# Patient Record
Sex: Female | Born: 1965 | Race: Black or African American | Hispanic: No | Smoking: Never smoker
Health system: Southern US, Community
[De-identification: ages and names within clinical notes are randomized; demographics above are authoritative.]

## PROBLEM LIST (undated history)

## (undated) DIAGNOSIS — R928 Other abnormal and inconclusive findings on diagnostic imaging of breast: Secondary | ICD-10-CM

## (undated) DIAGNOSIS — R87619 Unspecified abnormal cytological findings in specimens from cervix uteri: Secondary | ICD-10-CM

## (undated) HISTORY — DX: Unspecified abnormal cytological findings in specimens from cervix uteri: R87.619

---

## 2001-10-28 DIAGNOSIS — R87619 Unspecified abnormal cytological findings in specimens from cervix uteri: Secondary | ICD-10-CM

## 2001-10-28 HISTORY — DX: Unspecified abnormal cytological findings in specimens from cervix uteri: R87.619

## 2009-09-26 ENCOUNTER — Ambulatory Visit (HOSPITAL_COMMUNITY): Admission: RE | Admit: 2009-09-26 | Discharge: 2009-09-26 | Payer: Self-pay | Admitting: Obstetrics & Gynecology

## 2010-09-21 ENCOUNTER — Ambulatory Visit (HOSPITAL_COMMUNITY)
Admission: RE | Admit: 2010-09-21 | Discharge: 2010-09-21 | Payer: Self-pay | Source: Home / Self Care | Admitting: Obstetrics and Gynecology

## 2011-08-20 ENCOUNTER — Other Ambulatory Visit (HOSPITAL_COMMUNITY): Payer: Self-pay | Admitting: Physician Assistant

## 2011-09-13 ENCOUNTER — Other Ambulatory Visit (HOSPITAL_COMMUNITY): Payer: Self-pay | Admitting: Physician Assistant

## 2011-09-13 DIAGNOSIS — Z1231 Encounter for screening mammogram for malignant neoplasm of breast: Secondary | ICD-10-CM

## 2011-09-24 ENCOUNTER — Ambulatory Visit (HOSPITAL_COMMUNITY)
Admission: RE | Admit: 2011-09-24 | Discharge: 2011-09-24 | Disposition: A | Discharge status: Home / Self Care | Payer: Self-pay | Source: Ambulatory Visit | Attending: Physician Assistant | Admitting: Physician Assistant

## 2011-09-24 DIAGNOSIS — Z1231 Encounter for screening mammogram for malignant neoplasm of breast: Secondary | ICD-10-CM

## 2012-09-15 ENCOUNTER — Other Ambulatory Visit (HOSPITAL_COMMUNITY): Payer: Self-pay | Admitting: Physician Assistant

## 2012-09-15 DIAGNOSIS — Z1231 Encounter for screening mammogram for malignant neoplasm of breast: Secondary | ICD-10-CM

## 2012-10-02 ENCOUNTER — Ambulatory Visit (HOSPITAL_COMMUNITY)
Admission: RE | Admit: 2012-10-02 | Discharge: 2012-10-02 | Disposition: A | Discharge status: Home / Self Care | Payer: Self-pay | Source: Ambulatory Visit | Attending: Physician Assistant | Admitting: Physician Assistant

## 2012-10-02 DIAGNOSIS — Z1231 Encounter for screening mammogram for malignant neoplasm of breast: Secondary | ICD-10-CM

## 2013-11-16 ENCOUNTER — Other Ambulatory Visit (HOSPITAL_COMMUNITY): Payer: Self-pay | Admitting: Physician Assistant

## 2013-11-16 DIAGNOSIS — Z1231 Encounter for screening mammogram for malignant neoplasm of breast: Secondary | ICD-10-CM

## 2013-11-18 ENCOUNTER — Ambulatory Visit (HOSPITAL_COMMUNITY)
Admission: RE | Admit: 2013-11-18 | Discharge: 2013-11-18 | Disposition: A | Discharge status: Home / Self Care | Payer: Self-pay | Source: Ambulatory Visit | Attending: Physician Assistant | Admitting: Physician Assistant

## 2013-11-18 DIAGNOSIS — Z1231 Encounter for screening mammogram for malignant neoplasm of breast: Secondary | ICD-10-CM

## 2015-02-24 ENCOUNTER — Other Ambulatory Visit (HOSPITAL_COMMUNITY): Payer: Self-pay | Admitting: Physician Assistant

## 2015-02-24 DIAGNOSIS — Z1231 Encounter for screening mammogram for malignant neoplasm of breast: Secondary | ICD-10-CM

## 2015-03-06 ENCOUNTER — Ambulatory Visit (HOSPITAL_COMMUNITY): Payer: Self-pay

## 2016-11-21 LAB — POCT WET PREP WITH KOH
CLUE CELLS WET PREP PER HPF POC: NEGATIVE
KOH Prep POC: NEGATIVE
RBC WET PREP PER HPF POC: NEGATIVE
Trichomonas, UA: NEGATIVE
Yeast Wet Prep HPF POC: NEGATIVE

## 2017-07-02 ENCOUNTER — Encounter: Payer: Self-pay | Admitting: Internal Medicine

## 2017-07-02 ENCOUNTER — Ambulatory Visit (INDEPENDENT_AMBULATORY_CARE_PROVIDER_SITE_OTHER): Payer: Self-pay | Admitting: Internal Medicine

## 2017-07-02 VITALS — BP 120/80 | HR 66 | Resp 12 | Ht 63.0 in | Wt 133.0 lb

## 2017-07-02 DIAGNOSIS — Z594 Lack of adequate food and safe drinking water: Secondary | ICD-10-CM

## 2017-07-02 DIAGNOSIS — Z9189 Other specified personal risk factors, not elsewhere classified: Secondary | ICD-10-CM

## 2017-07-02 DIAGNOSIS — Z0120 Encounter for dental examination and cleaning without abnormal findings: Secondary | ICD-10-CM

## 2017-07-02 DIAGNOSIS — Z1239 Encounter for other screening for malignant neoplasm of breast: Secondary | ICD-10-CM

## 2017-07-02 DIAGNOSIS — Z5941 Food insecurity: Secondary | ICD-10-CM

## 2017-07-02 DIAGNOSIS — G4700 Insomnia, unspecified: Secondary | ICD-10-CM

## 2017-07-02 DIAGNOSIS — Z1231 Encounter for screening mammogram for malignant neoplasm of breast: Secondary | ICD-10-CM

## 2017-07-02 NOTE — Progress Notes (Signed)
   Subjective:    Patient ID: Madison Adkins, female    DOB: 07-14-1966, 51 y.o.   MRN: [id number]  HPI   Here to establish  Interested in getting set up for physical.    Would like to get up with mammogram.  Would like to get in with the dentist.  No problems of which she is aware.   Would like to have eyes examined.  Does not have the money, yet however.  Intermittently has difficulty getting to sleep  Very down she is unable to further her education.   Current Meds  Medication Sig  . VITAMIN D, ERGOCALCIFEROL, PO Take by mouth. occasionally    No Known Allergies   History reviewed. No pertinent past medical history.   History reviewed. No pertinent surgical history.   Family History  Problem Relation Age of Onset  . Cancer Sister        Breast  . Allergies Daughter     Social History   Social History  . Marital status: Divorced    Spouse name: N/A  . Number of children: 3  . Years of education: 12+   Occupational History  . House cleaning    Social History Main Topics  . Smoking status: Never Smoker  . Smokeless tobacco: Never Used  . Alcohol use No  . Drug use: No  . Sexual activity: Not on file   Other Topics Concern  . Not on file   Social History Narrative   Originally from [place]   Came to [place] [employer] In 2001   Lives with her 2 daughter.   Father not involved.   Goes to [employer] [place]   Would like to go to college            Review of Systems     Objective:   Physical Exam  NAD HEENT:  PERRL, EOMI, TMs pearly gray, throat without injection.    Neck:  Supple, No adenopathy, no thyromegaly Chest:  CTA Breasts:  No focal mass, skin dimpling, nipple discharge or axillary adenopathy. CV:  RRR with normal S1 and S2, No S3, S4 or murmur.  Radial and DP pulses normal and equal       Assessment & Plan:  1.  Health maintenance:  Schedule mammogram.  Scholarship as well.  2. Need for dental care:  Dental referral.  3. Insomnia:   Discussed good sleep hygiene, regular physical activity outside.  Read if unable to fall asleep among other things.  4.  Food insecurity:  SW intern working with patient.  Also referral to [facility] to see about where she can obtain food regularly.

## 2017-07-02 NOTE — Progress Notes (Signed)
Social Work Theatre manager completed new patient screening on Social Determinants of Health. Pt shared that she has worries about accessing food and having enough. Pt stated that she was not interested in help with resources due to a lack of transportation. SWI gave contact info.

## 2017-07-02 NOTE — Patient Instructions (Signed)
Look into [employer]

## 2017-07-15 ENCOUNTER — Other Ambulatory Visit: Payer: Self-pay | Admitting: Internal Medicine

## 2017-07-15 DIAGNOSIS — Z1231 Encounter for screening mammogram for malignant neoplasm of breast: Secondary | ICD-10-CM

## 2017-08-24 DIAGNOSIS — Z594 Lack of adequate food and safe drinking water: Secondary | ICD-10-CM | POA: Insufficient documentation

## 2017-08-24 DIAGNOSIS — G4700 Insomnia, unspecified: Secondary | ICD-10-CM | POA: Insufficient documentation

## 2017-08-24 DIAGNOSIS — Z5941 Food insecurity: Secondary | ICD-10-CM | POA: Insufficient documentation

## 2017-10-07 ENCOUNTER — Ambulatory Visit: Payer: Self-pay | Admitting: Internal Medicine

## 2017-10-28 DIAGNOSIS — E785 Hyperlipidemia, unspecified: Secondary | ICD-10-CM

## 2017-10-28 HISTORY — DX: Hyperlipidemia, unspecified: E78.5

## 2017-11-21 ENCOUNTER — Encounter: Payer: Self-pay | Admitting: Internal Medicine

## 2017-11-21 ENCOUNTER — Ambulatory Visit: Payer: Self-pay | Admitting: Internal Medicine

## 2017-11-21 VITALS — BP 110/78 | HR 62 | Resp 12 | Ht 63.0 in | Wt 135.5 lb

## 2017-11-21 DIAGNOSIS — Z13220 Encounter for screening for lipoid disorders: Secondary | ICD-10-CM

## 2017-11-21 DIAGNOSIS — Z124 Encounter for screening for malignant neoplasm of cervix: Secondary | ICD-10-CM

## 2017-11-21 DIAGNOSIS — Z0000 Encounter for general adult medical examination without abnormal findings: Secondary | ICD-10-CM

## 2017-11-21 NOTE — Patient Instructions (Addendum)
Can google "advance directives, [place]"  And bring up form from Secretary of [place]. Print and fill out Or can go to "5 wishes"  Which is also in Spanish and fill out--this costs $5--perhaps easier to use. Designate a Forensic scientist to speak for you if you are unable to speak for yourself when ill or injured  Call for eye appointment when you are ready

## 2017-11-21 NOTE — Progress Notes (Signed)
Subjective:    Patient ID: Madison Adkins, female    DOB: [birth year], 52 y.o.   MRN: [id number]  HPI   CPE with pap  1.  Pap:  Last pap 4-5 years ago at [facility].  History of abnormal pap many years ago.  She is not sure if she just had coloposcopy and biopsy or if she actually had some sort of treatment.  Not aware of any family history.   2.  Mammogram: Was referred for mammogram.  She states she was called for an appt.  She has tried to call back, but has not been successful in setting this up.  Sister died of breast cancer before age 20 yo.  3.  Osteoprevention: Limited dairy intake.  Previously was taking Calcium and Vitamin D tabs, but stopped .  Has never had bone density done.  She has not had a period since 52 years of age.  4.  Guaiac Cards:  Never.  Not aware of any family history of colon cancer  5.  Colonoscopy:  Never.  As above  6.  Immunizations:  She believes she had immunizations when came to [place] 2001 at [facility] in [place] and [place].  7.  Glucose/Cholesterol:  Never told her glucose or cholesterol is high.  No outpatient medications have been marked as taking for the 11/21/17 encounter (Office Visit) with Mack Hook, MD.    No Known Allergies   Past Medical History:  Diagnosis Date  . Abnormal Pap smear of cervix 2003   [facility]   History reviewed. No pertinent surgical history.   Social History   Socioeconomic History  . Marital status: Divorced    Spouse name: Not on file  . Number of children: 3  . Years of education: 12+  . Highest education level: Not on file  Social Needs  . Financial resource strain: Hard  . Food insecurity - worry: Often true  . Food insecurity - inability: Often true  . Transportation needs - medical: Yes  . Transportation needs - non-medical: Yes  Occupational History  . Occupation: Development worker, community  Tobacco Use  . Smoking status: Never Smoker  . Smokeless tobacco: Never Used  Substance and Sexual Activity  .  Alcohol use: No  . Drug use: No  . Sexual activity: No    Birth control/protection: Post-menopausal  Other Topics Concern  . Not on file  Social History Narrative   Originally from [place]   Came to [employer]. In [place]   Lives with her 2 daughters.   Father not involved.   Goes to [employer] [place]   Would like to go to college    Family History  Problem Relation Age of Onset  . Cancer Sister        Breast  . Allergies Daughter       Review of Systems  Constitutional: Negative for activity change, appetite change, fatigue and fever.  HENT: Positive for dental problem (needs to pick at teeth with toothpick). Negative for hearing loss and sore throat.   Eyes: Positive for visual disturbance (using reading glasses.  Unable to afford eye doctor currently.).  Respiratory: Negative for cough and shortness of breath.   Cardiovascular: Negative for chest pain, palpitations and leg swelling.  Gastrointestinal: Negative for abdominal pain, blood in stool (no melena), constipation and diarrhea.  Genitourinary: Negative for dysuria, frequency and vaginal discharge.  Musculoskeletal:       Left ring finger trigger finger.  Generally only first  thing in morning and only intermittent.  Has been a problem for 1 year.  Skin: Negative for rash.  Neurological: Negative for seizures, weakness and numbness.  Hematological: Negative for adenopathy.  Psychiatric/Behavioral: Negative for dysphoric mood. The patient is not nervous/anxious.        Objective:   Physical Exam  Constitutional: She is oriented to person, place, and time. She appears well-developed and well-nourished.  HENT:  Head: Normocephalic and atraumatic.  Right Ear: Hearing, tympanic membrane and external ear normal.  Left Ear: Hearing, tympanic membrane, external ear and ear canal normal.  Nose: Nose normal.  Mouth/Throat: Uvula is midline and oropharynx is clear and moist.  Eyes: Conjunctivae and EOM are normal. Pupils are  equal, round, and reactive to light.  Discs sharp bilaterally  Neck: Normal range of motion and full passive range of motion without pain. Neck supple. No thyromegaly present.  Cardiovascular: Normal rate, regular rhythm, S1 normal and S2 normal. Exam reveals no S3, no S4 and no friction rub.  No murmur heard. No carotid bruits.  Carotid, radial, femoral, DP and PT pulses normal and equal.   Pulmonary/Chest: Effort normal and breath sounds normal. Right breast exhibits no inverted nipple, no mass, no nipple discharge, no skin change and no tenderness. Left breast exhibits no inverted nipple, no mass, no nipple discharge, no skin change and no tenderness.  Abdominal: Soft. Bowel sounds are normal. She exhibits no mass. There is no hepatosplenomegaly. There is no tenderness. No hernia.  Genitourinary:  Genitourinary Comments: Normal external genitilia.  No cervical lesion. No uterine or adnexal mass or tenderness.  Musculoskeletal: Normal range of motion.  Lymphadenopathy:       Head (right side): No submental and no submandibular adenopathy present.       Head (left side): No submental and no submandibular adenopathy present.    She has no cervical adenopathy.    She has no axillary adenopathy.       Right: No inguinal and no supraclavicular adenopathy present.       Left: No inguinal and no supraclavicular adenopathy present.  Neurological: She is alert and oriented to person, place, and time. She has normal strength and normal reflexes. No cranial nerve deficit or sensory deficit. Coordination and gait normal.  Skin: Skin is warm. No rash noted.  Psychiatric: She has a normal mood and affect. Her speech is normal and behavior is normal. Thought content normal.          Assessment & Plan:  1.  CPE with pap. Checking into getting her scheduled for mammogram--apparent difficulty in communication from previous order. FLP, CBC, CMP  2.  Release for [facility] and [facility]  medical records  3.  Dental referral:  Looking into why she has not been contacted for dental referral from September.  4. Vision:  She will save for optometry appt through [facility] and notify when she wants to proceed.

## 2017-11-22 LAB — COMPREHENSIVE METABOLIC PANEL
A/G RATIO: 1.4 (ref 1.2–2.2)
ALBUMIN: 4.3 g/dL (ref 3.5–5.5)
ALT: 14 IU/L (ref 0–32)
AST: 17 IU/L (ref 0–40)
Alkaline Phosphatase: 47 IU/L (ref 39–117)
BILIRUBIN TOTAL: 0.2 mg/dL (ref 0.0–1.2)
BUN / CREAT RATIO: 18 (ref 9–23)
BUN: 11 mg/dL (ref 6–24)
CHLORIDE: 100 mmol/L (ref 96–106)
CO2: 28 mmol/L (ref 20–29)
Calcium: 9.6 mg/dL (ref 8.7–10.2)
Creatinine, Ser: 0.61 mg/dL (ref 0.57–1.00)
GFR, EST AFRICAN AMERICAN: 122 mL/min/{1.73_m2} (ref >59)
GFR, EST NON AFRICAN AMERICAN: 106 mL/min/{1.73_m2} (ref >59)
Globulin, Total: 3 g/dL (ref 1.5–4.5)
Glucose: 93 mg/dL (ref 65–99)
POTASSIUM: 4 mmol/L (ref 3.5–5.2)
Sodium: 140 mmol/L (ref 134–144)
TOTAL PROTEIN: 7.3 g/dL (ref 6.0–8.5)

## 2017-11-22 LAB — CBC WITH DIFFERENTIAL/PLATELET
Basophils Absolute: 0 10*3/uL (ref 0.0–0.2)
Basos: 0 %
EOS (ABSOLUTE): 0.1 10*3/uL (ref 0.0–0.4)
EOS: 1 %
HEMATOCRIT: 38.7 % (ref 34.0–46.6)
HEMOGLOBIN: 12.3 g/dL (ref 11.1–15.9)
IMMATURE GRANS (ABS): 0 10*3/uL (ref 0.0–0.1)
Immature Granulocytes: 0 %
LYMPHS: 55 %
Lymphocytes Absolute: 3.1 10*3/uL (ref 0.7–3.1)
MCH: 27.2 pg (ref 26.6–33.0)
MCHC: 31.8 g/dL (ref 31.5–35.7)
MCV: 85 fL (ref 79–97)
MONOCYTES: 7 %
Monocytes Absolute: 0.4 10*3/uL (ref 0.1–0.9)
NEUTROS ABS: 2.1 10*3/uL (ref 1.4–7.0)
Neutrophils: 37 %
Platelets: 223 10*3/uL (ref 150–379)
RBC: 4.53 x10E6/uL (ref 3.77–5.28)
RDW: 13.4 % (ref 12.3–15.4)
WBC: 5.7 10*3/uL (ref 3.4–10.8)

## 2017-11-22 LAB — LIPID PANEL W/O CHOL/HDL RATIO
Cholesterol, Total: 251 mg/dL — ABNORMAL HIGH (ref 100–199)
HDL: 84 mg/dL (ref >39)
LDL Calculated: 158 mg/dL — ABNORMAL HIGH (ref 0–99)
Triglycerides: 47 mg/dL (ref 0–149)
VLDL CHOLESTEROL CAL: 9 mg/dL (ref 5–40)

## 2017-11-24 LAB — CYTOLOGY - PAP

## 2017-12-04 ENCOUNTER — Other Ambulatory Visit: Payer: Self-pay

## 2017-12-05 NOTE — Progress Notes (Signed)
Patient scheduled for 01/15/18

## 2017-12-24 ENCOUNTER — Other Ambulatory Visit: Payer: Self-pay | Admitting: Obstetrics and Gynecology

## 2017-12-24 DIAGNOSIS — Z1231 Encounter for screening mammogram for malignant neoplasm of breast: Secondary | ICD-10-CM

## 2018-01-15 ENCOUNTER — Encounter (HOSPITAL_COMMUNITY): Payer: Self-pay

## 2018-01-15 ENCOUNTER — Ambulatory Visit (HOSPITAL_COMMUNITY)
Admission: RE | Admit: 2018-01-15 | Discharge: 2018-01-15 | Disposition: A | Discharge status: Home / Self Care | Payer: Self-pay | Source: Ambulatory Visit | Attending: Obstetrics and Gynecology | Admitting: Obstetrics and Gynecology

## 2018-01-15 ENCOUNTER — Ambulatory Visit
Admission: RE | Admit: 2018-01-15 | Discharge: 2018-01-15 | Disposition: A | Discharge status: Home / Self Care | Payer: No Typology Code available for payment source | Source: Ambulatory Visit | Attending: Obstetrics and Gynecology | Admitting: Obstetrics and Gynecology

## 2018-01-15 VITALS — BP 110/76 | Ht 63.0 in | Wt 134.2 lb

## 2018-01-15 DIAGNOSIS — Z1231 Encounter for screening mammogram for malignant neoplasm of breast: Secondary | ICD-10-CM

## 2018-01-15 DIAGNOSIS — Z1239 Encounter for other screening for malignant neoplasm of breast: Secondary | ICD-10-CM

## 2018-01-15 NOTE — Patient Instructions (Signed)
Explained breast self awareness with Shannara Laurie. Patient did not need a Pap smear today due to last Pap smear was 11/21/2017. Let her know [facility] will cover Pap smears every 3 years unless has a history of abnormal Pap smears. Referred patient to the [facility] for a screening mammogram. Appointment scheduled for Thursday, January 15, 2018 at 1310. Let patient know the [facility] Center will follow up with her within the next couple weeks with results of mammogram by letter or phone. Madison Adkins verbalized understanding.  Dahlia Nifong, Arvil Chaco, RN 12:49 PM

## 2018-01-15 NOTE — Progress Notes (Signed)
No complaints today.   Pap Smear: Pap smear not completed today. Last Pap smear was 11/21/2017 at [facility] and normal. Per patient has a history of an abnormal Pap smear in 2001 and unsure what follow-up if any was completed. Last Pap smear result is in Epic.  Physical exam: Breasts Breasts symmetrical. No skin abnormalities bilateral breasts. No nipple retraction bilateral breasts. No nipple discharge bilateral breasts. No lymphadenopathy. No lumps palpated bilateral breasts. No complaints of pain or tenderness on exam. Referred patient to the [facility] for a screening mammogram. Appointment scheduled for Thursday, January 15, 2018 at 1310.        Pelvic/Bimanual No Pap smear completed today since last Pap smear was 1/25/20019. Pap smear not indicated per BCCCP guidelines.   Smoking History: Patient has never smoked.  Patient Navigation: Patient education provided. Access to services provided for patient through [place] program.    Colorectal Cancer Screening: Per patient has never had a colonoscopy completed. No complaints today. FIT Test given to patient to complete and return to [facility].  Breast and Cervical Cancer Risk Assessment: Patient has a family history of her sister having breast cancer. Patient has no known genetic mutations or history of radiation treatment to the chest before age 64. Patient has no history of cervical dysplasia, immunocompromised, or DES exposure in-utero.

## 2018-01-20 ENCOUNTER — Encounter (HOSPITAL_COMMUNITY): Payer: Self-pay | Admitting: *Deleted

## 2018-02-02 ENCOUNTER — Other Ambulatory Visit: Payer: Self-pay

## 2018-02-08 LAB — FECAL OCCULT BLOOD, IMMUNOCHEMICAL: FECAL OCCULT BLD: NEGATIVE

## 2018-02-09 ENCOUNTER — Encounter (HOSPITAL_COMMUNITY): Payer: Self-pay

## 2018-03-31 ENCOUNTER — Other Ambulatory Visit: Payer: Self-pay

## 2018-03-31 DIAGNOSIS — Z13220 Encounter for screening for lipoid disorders: Secondary | ICD-10-CM

## 2018-04-01 LAB — LIPID PANEL W/O CHOL/HDL RATIO
Cholesterol, Total: 258 mg/dL — ABNORMAL HIGH (ref 100–199)
HDL: 83 mg/dL (ref >39)
LDL CALC: 164 mg/dL — AB (ref 0–99)
Triglycerides: 56 mg/dL (ref 0–149)
VLDL CHOLESTEROL CAL: 11 mg/dL (ref 5–40)

## 2018-04-21 ENCOUNTER — Encounter: Payer: Self-pay | Admitting: Internal Medicine

## 2018-08-31 ENCOUNTER — Other Ambulatory Visit: Payer: Self-pay

## 2018-08-31 DIAGNOSIS — E7800 Pure hypercholesterolemia, unspecified: Secondary | ICD-10-CM

## 2018-09-01 LAB — LIPID PANEL W/O CHOL/HDL RATIO
Cholesterol, Total: 245 mg/dL — ABNORMAL HIGH (ref 100–199)
HDL: 74 mg/dL (ref >39)
LDL CALC: 157 mg/dL — AB (ref 0–99)
Triglycerides: 72 mg/dL (ref 0–149)
VLDL Cholesterol Cal: 14 mg/dL (ref 5–40)

## 2018-09-02 ENCOUNTER — Encounter: Payer: Self-pay | Admitting: Internal Medicine

## 2018-09-02 ENCOUNTER — Ambulatory Visit: Payer: Self-pay | Admitting: Internal Medicine

## 2018-09-02 VITALS — BP 122/82 | HR 72 | Resp 12 | Ht 63.0 in | Wt 128.0 lb

## 2018-09-02 DIAGNOSIS — E7800 Pure hypercholesterolemia, unspecified: Secondary | ICD-10-CM

## 2018-09-02 NOTE — Progress Notes (Signed)
   Subjective:    Patient ID: Eleonore Shippee, female    DOB: 01/29/1966, 52 y.o.   MRN: [id number]  HPI  Hypercholesterolemia:  Admits that last month she was visiting her daughter and ws not eating as healthily as she could be doing.  Was using a lot of palm and coconut oil, which she has stopped and moved to olive oil  Also has returned to oatmeal recently daily.   2.  HM:  Did get flu vaccine.  States did get mammogram in March of 2019 with [facility]--she does have a card stating she is due, but states she did have it done as well.  Current Meds  Medication Sig  . VITAMIN D, ERGOCALCIFEROL, PO Take by mouth. occasionally    No Known Allergies  Review of Systems     Objective:   Physical Exam NAD Lungs:  CTA CV:  RRR without murmur or rub.  Radial and DP pulses normal and equal       Assessment & Plan:  Hypercholesterolemia:  Goal to get LDL below 130.  HDL is excellent.

## 2018-12-07 ENCOUNTER — Telehealth: Payer: Self-pay | Admitting: *Deleted

## 2018-12-07 NOTE — Telephone Encounter (Signed)
Telephoned patient left message to return a call to [facility] regarding a mammo appointment.

## 2018-12-08 ENCOUNTER — Other Ambulatory Visit (HOSPITAL_COMMUNITY): Payer: Self-pay | Admitting: *Deleted

## 2018-12-08 DIAGNOSIS — Z1231 Encounter for screening mammogram for malignant neoplasm of breast: Secondary | ICD-10-CM

## 2018-12-09 ENCOUNTER — Other Ambulatory Visit (HOSPITAL_COMMUNITY): Payer: Self-pay | Admitting: *Deleted

## 2018-12-09 DIAGNOSIS — Z1231 Encounter for screening mammogram for malignant neoplasm of breast: Secondary | ICD-10-CM

## 2018-12-14 ENCOUNTER — Other Ambulatory Visit (INDEPENDENT_AMBULATORY_CARE_PROVIDER_SITE_OTHER): Payer: Self-pay

## 2018-12-14 DIAGNOSIS — E7800 Pure hypercholesterolemia, unspecified: Secondary | ICD-10-CM

## 2018-12-15 LAB — LIPID PANEL W/O CHOL/HDL RATIO
Cholesterol, Total: 250 mg/dL — ABNORMAL HIGH (ref 100–199)
HDL: 75 mg/dL (ref >39)
LDL CALC: 162 mg/dL — AB (ref 0–99)
Triglycerides: 64 mg/dL (ref 0–149)
VLDL CHOLESTEROL CAL: 13 mg/dL (ref 5–40)

## 2018-12-16 ENCOUNTER — Encounter: Payer: Self-pay | Admitting: Internal Medicine

## 2018-12-16 ENCOUNTER — Ambulatory Visit: Payer: Self-pay | Admitting: Internal Medicine

## 2018-12-16 VITALS — BP 120/80 | HR 60 | Ht 63.0 in | Wt 125.0 lb

## 2018-12-16 DIAGNOSIS — Z9189 Other specified personal risk factors, not elsewhere classified: Secondary | ICD-10-CM

## 2018-12-16 DIAGNOSIS — Z23 Encounter for immunization: Secondary | ICD-10-CM

## 2018-12-16 DIAGNOSIS — L989 Disorder of the skin and subcutaneous tissue, unspecified: Secondary | ICD-10-CM

## 2018-12-16 DIAGNOSIS — Z0000 Encounter for general adult medical examination without abnormal findings: Secondary | ICD-10-CM

## 2018-12-16 NOTE — Progress Notes (Signed)
Subjective:    Patient ID: Madison Adkins, female   DOB: [birth year], 53 y.o.   MRN: [id number]   HPI   CPE without pap  1.  Pap:  Last pap 10/2017 and normal. Distant history of abnormal..  No family history of cervical cancer.  2.  Mammogram:  Last 01/15/18 and normal.  Scheduled for June of 2020 next.  Always normal.  Sister died of breast cancer at age 39 yo. [place].  3.  Osteoprevention:  Drinks almond milk and cheese sometimes.  She is willing to drink 3 cups of almond milk daily.  Walks all the time and loves to hike.    4.  Guaiac Cards:  Last year negative in April.  5.  Colonoscopy:  Never.  No family history of colon cancer.  6.  Immunizations:  Due for Tdap--has never had this form.  Immunization History  Administered Date(s) Administered  . Influenza-Unspecified 08/25/2018  . Td 08/28/2009    7.  Glucose/Cholesterol:  History of elevated total and LDL cholesterol, but HDL also quite high.  Father died age 41 and mother died at 43.  No autopsy done.  Father was ill leading up to death, but patient away in boarding school at the time.   Mother died after complained of throat vs neck pain and was sent home from the hospital.   Current Meds  Medication Sig  . VITAMIN D, ERGOCALCIFEROL, PO Take by mouth. occasionally   No Known Allergies   Past Medical History:  Diagnosis Date  . Abnormal Pap smear of cervix 2003   [facility]    History reviewed. No pertinent surgical history.  Family History  Problem Relation Age of Onset  . Cancer Sister        Breast  . Allergies Daughter     Social History   Socioeconomic History  . Marital status: Divorced    Spouse name: Not on file  . Number of children: 3  . Years of education: 12+  . Highest education level: Not on file  Occupational History  . Occupation: Development worker, community  Social Needs  . Financial resource strain: Hard  . Food insecurity:    Worry: Often true    Inability: Often true  .  Transportation needs:    Medical: Yes    Non-medical: Yes  Tobacco Use  . Smoking status: Never Smoker  . Smokeless tobacco: Never Used  Substance and Sexual Activity  . Alcohol use: Yes    Comment: rarely  . Drug use: No  . Sexual activity: Never    Birth control/protection: Post-menopausal  Lifestyle  . Physical activity:    Days per week: 7 days    Minutes per session: 20 min  . Stress: To some extent  Relationships  . Social connections:    Talks on phone: Once a week    Gets together: More than three times a week    Attends religious service: More than 4 times per year    Active member of club or organization: Yes    Attends meetings of clubs or organizations: More than 4 times per year    Relationship status: Divorced  . Intimate partner violence:    Fear of current or ex partner: No    Emotionally abused: No    Physically abused: No    Forced sexual activity: No  Other Topics Concern  . Not on file  Social History Narrative   Originally from [place]   [place]ame [place]   Lives with her 2 daughters.   Father not involved.   Go[place] to [employer] [place]   Would like to go to college.  She is waiting until her daughters are 52 yo.      Review of Systems  Constitutional: Negative for appetite change, fatigue and fever.  HENT: Negative for dental problem, ear pain, hearing loss, rhinorrhea and sore throat.   Eyes: Negative for visual disturbance.  Respiratory: Negative for shortness of breath.   Cardiovascular: Negative for chest pain, palpitations and leg swelling.  Gastrointestinal: Negative for abdominal pain, blood in stool (No melena), constipation and diarrhea.  Genitourinary: Negative for dysuria, frequency, menstrual problem and vaginal discharge.  Musculoskeletal: Negative for arthralgias.  Neurological: Negative for weakness and numbness.  Psychiatric/Behavioral: Negative for dysphoric mood. The patient is not nervous/anxious.       Objective:     BP 120/80 (BP Location: Left Arm, Patient Position: Sitting, Cuff Size: Normal)   Pulse 60   Ht 5\' 3"  (1.6 m)   Wt 125 lb (56.7 kg)   LMP 09/11/2011   BMI 22.14 kg/m   Physical Exam  Constitutional: She is oriented to person, place, and time. She appears well-developed and well-nourished.  HENT:  Head: Normocephalic and atraumatic.  Right Ear: Hearing, external ear and ear canal normal.  Left Ear: Hearing, external ear and ear canal normal.  Nose: Nose normal.  Mouth/Throat: Uvula is midline, oropharynx is clear and moist and mucous membranes are normal.  TMs a bit dull, but normal in color.  Eyes: Pupils are equal, round, and reactive to light. Conjunctivae and EOM are normal.  Discs sharp bilaterally.  Neck: Normal range of motion and full passive range of motion without pain. Neck supple. Thyromegaly (smooth enlargement.) present.  Cardiovascular: Normal rate, regular rhythm, S1 normal and S2 normal. Exam reveals no S3, no S4 and no friction rub.  No murmur heard. No carotid bruits.  Carotid, radial, femoral, DP and PT pulses normal and equal.   Pulmonary/Chest: Effort normal and breath sounds normal. Right breast exhibits no inverted nipple, no mass, no nipple discharge and no skin change. Left breast exhibits no inverted nipple, no mass, no nipple discharge and no skin change.  Abdominal: Soft. Bowel sounds are normal. She exhibits no mass. There is no hepatosplenomegaly. There is no abdominal tenderness. No hernia.  Genitourinary:    Genitourinary Comments: Normal external female genitalia. No uterine or adnexal mass or tenderness. Rectal:  No mass, heme negative light brown stool.   Musculoskeletal: Normal range of motion.  Lymphadenopathy:       Head (right side): No submental and no submandibular adenopathy present.       Head (left side): No submental and no submandibular adenopathy present.    She has no cervical adenopathy.    She has no axillary adenopathy.        Right: No inguinal and no supraclavicular adenopathy present.       Left: No inguinal and no supraclavicular adenopathy present.  Neurological: She is alert and oriented to person, place, and time. She has normal strength and normal reflexes. No cranial nerve deficit or sensory deficit. Coordination and gait normal.  Skin: Skin is warm. No rash noted.  1 cm hyperpigmented lesion at left frontal hair line. No bleeding or flaking.  Areas of lesion are slightly palpable  Psychiatric: She has a normal mood and affect. Her speech is normal and behavior is normal. Judgment and thought  content normal. Cognition and memory are normal.       Assessment & Plan   1.  Annual Exam Mammogram scheduled for later in year. Guaiac Cards x 3 to return in 2 weeks. Will need TSH as well then.   2.  Elevated cholesterol with very high HDL and somewhat high LDL:  To continue to work on diet if has changes to make for improvement.  3.  Looking to help with education:  Referral to [facility] to see about the possibility of [place].  I have contacted Eligah East.  He will look into [employer] for the possibility of online courses.  I will check with [facility]. Signed consent for [facility] as well.  4.  Dental referral  5.  Skin lesion, left frontal hairline:  Appears to be benign, but will send to Dermatology for opinion.

## 2018-12-17 ENCOUNTER — Encounter: Payer: Self-pay | Admitting: Internal Medicine

## 2018-12-23 ENCOUNTER — Other Ambulatory Visit (INDEPENDENT_AMBULATORY_CARE_PROVIDER_SITE_OTHER): Payer: Self-pay

## 2018-12-23 DIAGNOSIS — Z1211 Encounter for screening for malignant neoplasm of colon: Secondary | ICD-10-CM

## 2018-12-23 LAB — POC HEMOCCULT BLD/STL (HOME/3-CARD/SCREEN)
Card #2 Fecal Occult Blod, POC: NEGATIVE
FECAL OCCULT BLD: NEGATIVE
Fecal Occult Blood, POC: NEGATIVE

## 2019-01-04 ENCOUNTER — Telehealth: Payer: Self-pay | Admitting: Internal Medicine

## 2019-01-04 NOTE — Telephone Encounter (Signed)
Patient called to see if Dr. Amil Amen has checked on college for her.   [phone]

## 2019-01-06 NOTE — Telephone Encounter (Signed)
Please let her know I have emailed someone at [facility] and hope to hear back soon.  In the meantime, have her google the [employer] and see what she thinks--this is what I AM LOOKING INTO FOR HER.  I dont know why my computer would not let me type without caps, but not shouting there.

## 2019-01-21 NOTE — Telephone Encounter (Signed)
Spoke with patient. Information given. Patient verbalized understanding

## 2019-03-16 ENCOUNTER — Ambulatory Visit (HOSPITAL_COMMUNITY): Payer: No Typology Code available for payment source

## 2019-04-06 ENCOUNTER — Encounter (HOSPITAL_COMMUNITY): Payer: Self-pay

## 2019-04-06 ENCOUNTER — Ambulatory Visit
Admission: RE | Admit: 2019-04-06 | Discharge: 2019-04-06 | Disposition: A | Discharge status: Home / Self Care | Payer: Self-pay | Source: Ambulatory Visit | Attending: Obstetrics and Gynecology | Admitting: Obstetrics and Gynecology

## 2019-04-06 ENCOUNTER — Other Ambulatory Visit: Payer: Self-pay

## 2019-04-06 ENCOUNTER — Ambulatory Visit (HOSPITAL_COMMUNITY)
Admission: RE | Admit: 2019-04-06 | Discharge: 2019-04-06 | Disposition: A | Discharge status: Home / Self Care | Payer: No Typology Code available for payment source | Source: Ambulatory Visit | Attending: Obstetrics and Gynecology | Admitting: Obstetrics and Gynecology

## 2019-04-06 VITALS — BP 108/64 | Temp 99.0°F | Wt 128.0 lb

## 2019-04-06 DIAGNOSIS — Z1239 Encounter for other screening for malignant neoplasm of breast: Secondary | ICD-10-CM

## 2019-04-06 DIAGNOSIS — Z1231 Encounter for screening mammogram for malignant neoplasm of breast: Secondary | ICD-10-CM

## 2019-04-06 NOTE — Patient Instructions (Signed)
Explained breast self awareness with Madison Adkins. Patient did not need a Pap smear today due to last Pap smear was 11/21/2017. Let her know [facility] will cover Pap smears every 3 years unless has a history of abnormal Pap smears. Referred patient to the [facility] for a screening mammogram. Appointment scheduled for Tuesday, April 06, 2019 at 1530. Patient aware of appointment and will be there. Let patient know the Breast Center will follow up with her within the next couple weeks with results of mammogram by letter or phone. [name] verbalized understanding.  Sincere Liuzzi, Arvil Chaco, RN 2:25 PM

## 2019-04-06 NOTE — Progress Notes (Signed)
No complaints today.   Pap Smear: Pap smear not completed today. Last Pap smear was 11/21/2017 at [facility] and normal. Per patient has a history of an abnormal Pap smear in 2001 that thinks a colposcopy was completed for follow-up. Per patient has had at least three normal Pap smears since abnormal. Last Pap smear result is in Epic.  Physical exam: Breasts Breasts symmetrical. No skin abnormalities bilateral breasts. No nipple retraction bilateral breasts. No nipple discharge bilateral breasts. No lymphadenopathy. No lumps palpated bilateral breasts. No complaints of pain or tenderness on exam. Referred patient to the [facility] for a screening mammogram. Appointment scheduled for Tuesday, April 06, 2019 at 1530.        Pelvic/Bimanual No Pap smear completed today since last Pap smear was 11/21/2017. Pap smear not indicated per BCCCP guidelines.   Smoking History: Patient has never smoked.  Patient Navigation: Patient education provided. Access to services provided for patient through [place] program.   Colorectal Cancer Screening: Per patient has never had a colonoscopy completed. FIT Test completed 12/23/2018 that was negative. No complaints today.   Breast and Cervical Cancer Risk Assessment: Patient has a family hisory of her sister having breast cancer. Patient has no known genetic mutations or history of radiation treatment to the chest before age 58. Per patient has a history of cervical dysplasia. Patient has no history of being immunocompromised or DES exposure in-utero.  Risk Assessment    Risk Scores      04/06/2019   Last edited by: Armond Hang, LPN   5-year risk: 1.9 %   Lifetime risk: 13.3 %

## 2019-04-07 ENCOUNTER — Encounter (HOSPITAL_COMMUNITY): Payer: Self-pay | Admitting: *Deleted

## 2020-02-03 ENCOUNTER — Ambulatory Visit: Payer: No Typology Code available for payment source | Attending: Internal Medicine

## 2020-02-03 DIAGNOSIS — Z23 Encounter for immunization: Secondary | ICD-10-CM

## 2020-02-03 NOTE — Progress Notes (Signed)
   Covid-19 Vaccination Clinic  Name:  Nyeli Diosdado    MRN: [id number] DOB: 02/10/1966  02/03/2020  Ms. Tio was observed post Covid-19 immunization for 15 minutes without incident. She was provided with Vaccine Information Sheet and instruction to access the V-Safe system.   Ms. Asay was instructed to call 911 with any severe reactions post vaccine: Marland Kitchen Difficulty breathing  . Swelling of face and throat  . A fast heartbeat  . A bad rash all over body  . Dizziness and weakness   Immunizations Administered    Name Date Dose VIS Date Route   Pfizer COVID-19 Vaccine 02/03/2020  4:35 PM 0.3 mL 10/08/2019 Intramuscular   Manufacturer: [place]   Lot: [id number]   [place]: [id number]

## 2020-02-28 ENCOUNTER — Ambulatory Visit: Payer: No Typology Code available for payment source | Attending: Internal Medicine

## 2020-02-28 DIAGNOSIS — Z23 Encounter for immunization: Secondary | ICD-10-CM

## 2020-02-28 NOTE — Progress Notes (Signed)
   Covid-19 Vaccination Clinic  Name:  Madison Adkins    MRN: [id number] DOB: 1966/08/10  02/28/2020  Madison Adkins was observed post Covid-19 immunization for 15 minutes without incident. She was provided with Vaccine Information Sheet and instruction to access the V-Safe system.   Madison Adkins was instructed to call 911 with any severe reactions post vaccine: Marland Kitchen Difficulty breathing  . Swelling of face and throat  . A fast heartbeat  . A bad rash all over body  . Dizziness and weakness   Immunizations Administered    Name Date Dose VIS Date Route   Pfizer COVID-19 Vaccine 02/28/2020  3:38 PM 0.3 mL 12/22/2018 Intramuscular   Manufacturer: Sunflower   Lot: [id number]   [place]: [id number]

## 2020-05-26 ENCOUNTER — Encounter: Payer: Self-pay | Admitting: Internal Medicine

## 2020-07-26 ENCOUNTER — Ambulatory Visit: Payer: Self-pay | Admitting: Internal Medicine

## 2020-07-26 ENCOUNTER — Encounter: Payer: Self-pay | Admitting: Internal Medicine

## 2020-07-26 VITALS — BP 118/70 | HR 70 | Resp 12 | Ht 63.0 in | Wt 116.0 lb

## 2020-07-26 DIAGNOSIS — Z0000 Encounter for general adult medical examination without abnormal findings: Secondary | ICD-10-CM

## 2020-07-26 DIAGNOSIS — E7800 Pure hypercholesterolemia, unspecified: Secondary | ICD-10-CM

## 2020-07-26 DIAGNOSIS — H547 Unspecified visual loss: Secondary | ICD-10-CM

## 2020-07-26 DIAGNOSIS — Z1231 Encounter for screening mammogram for malignant neoplasm of breast: Secondary | ICD-10-CM

## 2020-07-26 DIAGNOSIS — Z9189 Other specified personal risk factors, not elsewhere classified: Secondary | ICD-10-CM

## 2020-07-26 NOTE — Patient Instructions (Signed)
Calcium 600 mg with 200 IU of Vitamin D twice daily.

## 2020-07-26 NOTE — Progress Notes (Signed)
Subjective:    Patient ID: Madison Adkins, female   DOB: 1966-05-08, 53 y.o.   MRN: [id number]   HPI   CPE with pap  1.  Pap:  Last pap was 10/2017 and normal. Possible history of abnormal result-very distant..  2.  Mammogram:  Last mammogram April 24, 2019 and normal.  Sister died of breast cancer at age 60.  [place].    3.  Osteoprevention:  Does take a calcium and Vitamin D tab once daily.  Not sure of mg or IU.    4.  Guaiac Cards:  Last performed 11/2018 and negative.  5.  Colonoscopy:  Never.  Not aware of any family member with colon cancer history.    6.  Immunizations:   Immunization History  Administered Date(s) Administered   Influenza-Unspecified 08/25/2018   PFIZER SARS-COV-2 Vaccination 02/03/2020, 02/28/2020   Td 08/28/2009   Tdap 12/16/2018     7.  Glucose/Cholesterol:  No hyperglycemia in past.  Cholesterol is high, but HDL is high as well.  Lipid Panel     Component Value Date/Time   CHOL 250 (H) 12/14/2018 0909   TRIG 64 12/14/2018 0909   HDL 75 12/14/2018 0909   LDLCALC 162 (H) 12/14/2018 0909   LABVLDL 13 12/14/2018 0909     Current Meds  Medication Sig   VITAMIN D, ERGOCALCIFEROL, PO Take by mouth. occasionally   No Known Allergies  Past Medical History:  Diagnosis Date   Abnormal mammogram    Abnormal Pap smear of cervix 2003   [facility]   Hyperlipidemia 2019    Past Surgical History:  Procedure Laterality Date   NO PAST SURGERIES     RADIOACTIVE SEED GUIDED EXCISIONAL BREAST BIOPSY Left 01/10/2021   Procedure: LEFT BREAST SEED LOCALIZED EXCISIONAL BIOPSY;  Surgeon: Stark Klein, MD;  Location: [place];  Service: General;  Laterality: Left;    Family History  Problem Relation Age of Onset   Cancer Sister        Breast   Breast cancer Sister    Allergies Daughter    Cancer Daughter        Ovarian cancer--died September 24, 2019.   Social History   Socioeconomic History   Marital status: Divorced    Spouse name: Not  on file   Number of children: 3   Years of education: 12+   Highest education level: Some college, no degree  Occupational History   Occupation: Development worker, community  Tobacco Use   Smoking status: Never   Smokeless tobacco: Never  Vaping Use   Vaping Use: Never used  Substance and Sexual Activity   Alcohol use: Yes    Comment: rarely   Drug use: No   Sexual activity: Not Currently    Birth control/protection: Post-menopausal  Other Topics Concern   Not on file  Social History Narrative   Originally from [place]   Came to [place] In 2001   Lives with her 2 daughters.   Father not involved.   [place]es to [employer] [place]   Would like to go to college.  She is waiting until her daughters are 35 yo.    Social Determinants of Health   Financial Resource Strain: Medium Risk   Difficulty of Paying Living Expenses: Somewhat hard  Food Insecurity: Food Insecurity Present   Worried About [place] in the Last Year: Sometimes true   Ran Out of Food in the Last Year: Sometimes true  Transportation Needs:  Unmet Transportation Needs   Lack of Transportation (Medical): Yes   Lack of Transportation (Non-Medical): Yes  Physical Activity: Not on file  Stress: Not on file  Social Connections: Not on file  Intimate Partner Violence: Not At Risk   Fear of Current or Ex-Partner: No   Emotionally Abused: No   Physically Abused: No   Sexually Abused: No    Review of Systems  Constitutional: Negative for appetite change and fatigue.  HENT: Negative for dental problem, hearing loss, sinus pressure and sore throat.   Eyes: Negative for visual disturbance (wears glasses.).  Respiratory: Negative for cough and shortness of breath.   Cardiovascular: Negative for chest pain, palpitations and leg swelling.  Gastrointestinal: Negative for abdominal pain, blood in stool (No melena), constipation and diarrhea.  Genitourinary: Negative for vaginal discharge.  Musculoskeletal: Negative for  arthralgias.  Skin: Negative for rash.  Neurological: Positive for numbness (mildly in middle fingers at times.). Negative for weakness.  Psychiatric/Behavioral: Negative for dysphoric mood. The patient is not nervous/anxious.       Objective:   BP 118/70 (BP Location: Left Arm, Patient Position: Sitting, Cuff Size: Normal)   Pulse 70   Resp 12   Ht 5\' 3"  (1.6 m)   Wt 116 lb (52.6 kg)   LMP 09/11/2011   BMI 20.55 kg/m   Physical Exam Constitutional:      Appearance: She is normal weight.  HENT:     Head: Normocephalic and atraumatic.     Right Ear: Hearing, tympanic membrane, ear canal and external ear normal.     Left Ear: Hearing, tympanic membrane, ear canal and external ear normal.     Nose: Nose normal.     Mouth/Throat:     Mouth: Mucous membranes are moist.     Pharynx: Oropharynx is clear.  Eyes:     Extraocular Movements: Extraocular movements intact.     Conjunctiva/sclera: Conjunctivae normal.     Pupils: Pupils are equal, round, and reactive to light.     Comments: Discs sharp bilaterally.  Neck:     Thyroid: No thyroid mass or thyromegaly.  Cardiovascular:     Rate and Rhythm: Normal rate and regular rhythm.     Heart sounds: S1 normal and S2 normal. No murmur heard.   No friction rub. No S3 or S4 sounds.     Comments: No carotid bruits.  Carotid, radial, femoral, DP and PT pulses normal and equal.    Pulmonary:     Effort: Pulmonary effort is normal.     Breath sounds: Normal breath sounds.  Chest:  Breasts:    Right: No mass, nipple discharge, skin change, tenderness, axillary adenopathy or supraclavicular adenopathy.     Left: No mass, nipple discharge, skin change, tenderness, axillary adenopathy or supraclavicular adenopathy.  Abdominal:     General: Bowel sounds are normal.     Palpations: Abdomen is soft. There is no hepatomegaly, splenomegaly or mass.     Tenderness: There is no abdominal tenderness.     Hernia: No hernia is present.   Genitourinary:    Comments: Normal external female genitalia No uterine or adnexal mass or tenderness Musculoskeletal:        General: Normal range of motion.     Cervical back: Normal range of motion and neck supple.  Lymphadenopathy:     Head:     Right side of head: No submental or submandibular adenopathy.     Left side of head: No submental or submandibular  adenopathy.     Cervical: No cervical adenopathy.     Upper Body:     Right upper body: No supraclavicular or axillary adenopathy.     Left upper body: No supraclavicular or axillary adenopathy.     Lower Body: No right inguinal adenopathy. No left inguinal adenopathy.  Skin:    General: Skin is warm.     Capillary Refill: Capillary refill takes less than 2 seconds.     Findings: No rash.  Neurological:     Mental Status: She is alert.     Cranial Nerves: Cranial nerves are intact.     Sensory: Sensation is intact.     Motor: Motor function is intact.     Coordination: Coordination is intact.     Gait: Gait is intact.     Deep Tendon Reflexes: Reflexes are normal and symmetric.  Psychiatric:        Attention and Perception: Attention normal.        Mood and Affect: Mood normal.        Speech: Speech normal.        Behavior: Behavior normal. Behavior is cooperative.        Thought Content: Thought content normal.     Assessment & Plan  1.  CPE without pap Mammogram Guaiac Cards X 3 to return in 2 weeks. Check on bus passes. Check with [facility] with food deliveries. Calcium and Vitamin D supplementation. CBC, CMP, FLP  2.  Decreased Visual acuity:  optometry referral  3.  Need for Dental care:  Dental referral.    4.  Hypercholesterolemia:  FLP as above.

## 2020-07-27 LAB — COMPREHENSIVE METABOLIC PANEL
ALT: 11 IU/L (ref 0–32)
AST: 17 IU/L (ref 0–40)
Albumin/Globulin Ratio: 1.6 (ref 1.2–2.2)
Albumin: 4.7 g/dL (ref 3.8–4.9)
Alkaline Phosphatase: 50 IU/L (ref 44–121)
BUN/Creatinine Ratio: 13 (ref 9–23)
BUN: 9 mg/dL (ref 6–24)
Bilirubin Total: 0.5 mg/dL (ref 0.0–1.2)
CO2: 29 mmol/L (ref 20–29)
Calcium: 10.1 mg/dL (ref 8.7–10.2)
Chloride: 101 mmol/L (ref 96–106)
Creatinine, Ser: 0.69 mg/dL (ref 0.57–1.00)
GFR calc Af Amer: 115 mL/min/{1.73_m2} (ref >59)
GFR calc non Af Amer: 100 mL/min/{1.73_m2} (ref >59)
Globulin, Total: 2.9 g/dL (ref 1.5–4.5)
Glucose: 78 mg/dL (ref 65–99)
Potassium: 3.8 mmol/L (ref 3.5–5.2)
Sodium: 141 mmol/L (ref 134–144)
Total Protein: 7.6 g/dL (ref 6.0–8.5)

## 2020-07-27 LAB — CBC WITH DIFFERENTIAL/PLATELET
Basophils Absolute: 0 10*3/uL (ref 0.0–0.2)
Basos: 1 %
EOS (ABSOLUTE): 0 10*3/uL (ref 0.0–0.4)
Eos: 1 %
Hematocrit: 37.3 % (ref 34.0–46.6)
Hemoglobin: 12.1 g/dL (ref 11.1–15.9)
Immature Grans (Abs): 0 10*3/uL (ref 0.0–0.1)
Immature Granulocytes: 0 %
Lymphocytes Absolute: 3.3 10*3/uL — ABNORMAL HIGH (ref 0.7–3.1)
Lymphs: 53 %
MCH: 27.6 pg (ref 26.6–33.0)
MCHC: 32.4 g/dL (ref 31.5–35.7)
MCV: 85 fL (ref 79–97)
Monocytes Absolute: 0.5 10*3/uL (ref 0.1–0.9)
Monocytes: 8 %
Neutrophils Absolute: 2.3 10*3/uL (ref 1.4–7.0)
Neutrophils: 37 %
Platelets: 214 10*3/uL (ref 150–450)
RBC: 4.38 x10E6/uL (ref 3.77–5.28)
RDW: 12.3 % (ref 11.7–15.4)
WBC: 6.2 10*3/uL (ref 3.4–10.8)

## 2020-07-27 LAB — LIPID PANEL W/O CHOL/HDL RATIO
Cholesterol, Total: 240 mg/dL — ABNORMAL HIGH (ref 100–199)
HDL: 92 mg/dL (ref >39)
LDL Chol Calc (NIH): 137 mg/dL — ABNORMAL HIGH (ref 0–99)
Triglycerides: 65 mg/dL (ref 0–149)
VLDL Cholesterol Cal: 11 mg/dL (ref 5–40)

## 2020-07-31 ENCOUNTER — Encounter: Payer: Self-pay | Admitting: Internal Medicine

## 2020-07-31 ENCOUNTER — Other Ambulatory Visit: Payer: Self-pay

## 2020-07-31 NOTE — Progress Notes (Signed)
Patient called to find out who to contact for mammography scholarship fund.  Emailed Christine w/[facility] to forward message to appropriate person. Obtained contact number for patient and provided as well.

## 2020-08-01 ENCOUNTER — Telehealth: Payer: Self-pay

## 2020-08-01 NOTE — Telephone Encounter (Signed)
Attempted to contact patient at home number. Left a voice message with [facility] scheduling information.

## 2020-08-08 ENCOUNTER — Other Ambulatory Visit: Payer: Self-pay | Admitting: Obstetrics and Gynecology

## 2020-08-08 DIAGNOSIS — Z1231 Encounter for screening mammogram for malignant neoplasm of breast: Secondary | ICD-10-CM

## 2020-09-19 ENCOUNTER — Ambulatory Visit: Payer: Self-pay | Admitting: *Deleted

## 2020-09-19 ENCOUNTER — Other Ambulatory Visit: Payer: Self-pay

## 2020-09-19 ENCOUNTER — Ambulatory Visit
Admission: RE | Admit: 2020-09-19 | Discharge: 2020-09-19 | Disposition: A | Discharge status: Home / Self Care | Payer: No Typology Code available for payment source | Source: Ambulatory Visit | Attending: Obstetrics and Gynecology | Admitting: Obstetrics and Gynecology

## 2020-09-19 VITALS — BP 116/74 | Wt 126.0 lb

## 2020-09-19 DIAGNOSIS — R928 Other abnormal and inconclusive findings on diagnostic imaging of breast: Secondary | ICD-10-CM

## 2020-09-19 DIAGNOSIS — Z1239 Encounter for other screening for malignant neoplasm of breast: Secondary | ICD-10-CM

## 2020-09-19 DIAGNOSIS — Z1231 Encounter for screening mammogram for malignant neoplasm of breast: Secondary | ICD-10-CM

## 2020-09-19 HISTORY — DX: Other abnormal and inconclusive findings on diagnostic imaging of breast: R92.8

## 2020-09-19 NOTE — Progress Notes (Signed)
[name] is a 54 y.o. female who presents to [facility] clinic today with no complaints.    Pap Smear: Pap smear not completed today. Last Pap smear was 11/21/2017 at [facility] and normal. Per patient has a history of an abnormal Pap smear in 2001 that thinks a colposcopy was completed for follow-up. Per patient has had at least three normal Pap smears since abnormal. Last Pap smear result is available in Epic.   Physical exam: Breasts Breasts symmetrical. No skin abnormalities bilateral breasts. No nipple retraction bilateral breasts. No nipple discharge bilateral breasts. No lymphadenopathy. No lumps palpated bilateral breasts. No complaints of pain or tenderness on exam. No complaints of pain or tenderness on exam.       Pelvic/Bimanual Pap is not indicated today per [facility] guidelines.    Smoking History: Patient has never smoked.   Patient Navigation: Patient education provided. Access to services provided for patient through [facility] program. Transportation provided home following [facility] and mammogram appointment.   Colorectal Cancer Screening: Per patient has never had a colonoscopy completed. FIT Test completed 12/23/2018 that was negative. No complaints today.    Breast and Cervical Cancer Risk Assessment: Patient has a family hisory of her sister having breast cancer. Patient has no known genetic mutations or history of radiation treatment to the chest before age 50. Per patient has a history of cervical dysplasia. Patient has no history of being immunocompromised or DES exposure in-utero.  Risk Assessment    Risk Scores      09/19/2020 04/06/2019   Last edited by: Madison Revel, LPN Madison Infante H, LPN   5-year risk: 2.1 % 1.9 %   Lifetime risk: 12.8 % 13.3 %           A: [facility] exam without pap smear No complaints.  P: Referred patient to the [facility] for a screening mammogram on the mobile unit. Appointment scheduled Tuesday, September 19, 2020 at 1530.  Madison Parish, RN 09/19/2020 2:35 PM

## 2020-09-19 NOTE — Patient Instructions (Signed)
Explained breast self awareness with Artesha Ariel. Patient did not need a Pap smear today due to last Pap smear was 11/21/2017. Let her know [facility] will cover Pap smears every 3 years unless has a history of abnormal Pap smears. Patient scheduled to come to free cervical cancer screening on Monday, November 21, 2019 at [place]. Referred patient to the [facility] for a screening mammogram on the mobile unit. Appointment scheduled Tuesday, September 19, 2020 at 1530. Patient escorted to mobile unit following [facility] appointment for screening mammogram. Let patient know the [facility] will follow up with her within the next couple weeks with results of her mammogram by letter or phone. Massiah Talkington verbalized understanding.  Oniel Meleski, Arvil Chaco, RN 2:35 PM

## 2020-09-26 ENCOUNTER — Other Ambulatory Visit: Payer: Self-pay | Admitting: Obstetrics and Gynecology

## 2020-09-26 DIAGNOSIS — R928 Other abnormal and inconclusive findings on diagnostic imaging of breast: Secondary | ICD-10-CM

## 2020-09-30 ENCOUNTER — Ambulatory Visit
Admission: RE | Admit: 2020-09-30 | Discharge: 2020-09-30 | Disposition: A | Discharge status: Home / Self Care | Payer: No Typology Code available for payment source | Source: Ambulatory Visit | Attending: Obstetrics and Gynecology | Admitting: Obstetrics and Gynecology

## 2020-09-30 ENCOUNTER — Other Ambulatory Visit: Payer: Self-pay | Admitting: Obstetrics and Gynecology

## 2020-09-30 ENCOUNTER — Other Ambulatory Visit: Payer: Self-pay

## 2020-09-30 DIAGNOSIS — R928 Other abnormal and inconclusive findings on diagnostic imaging of breast: Secondary | ICD-10-CM

## 2020-10-17 ENCOUNTER — Other Ambulatory Visit: Payer: Self-pay | Admitting: Obstetrics and Gynecology

## 2020-10-17 ENCOUNTER — Ambulatory Visit
Admission: RE | Admit: 2020-10-17 | Discharge: 2020-10-17 | Disposition: A | Discharge status: Home / Self Care | Payer: No Typology Code available for payment source | Source: Ambulatory Visit | Attending: Obstetrics and Gynecology | Admitting: Obstetrics and Gynecology

## 2020-10-17 ENCOUNTER — Other Ambulatory Visit: Payer: Self-pay

## 2020-10-17 DIAGNOSIS — R928 Other abnormal and inconclusive findings on diagnostic imaging of breast: Secondary | ICD-10-CM

## 2020-11-06 ENCOUNTER — Other Ambulatory Visit: Payer: Self-pay

## 2020-11-06 ENCOUNTER — Other Ambulatory Visit: Payer: No Typology Code available for payment source

## 2020-11-06 DIAGNOSIS — Z20822 Contact with and (suspected) exposure to covid-19: Secondary | ICD-10-CM

## 2020-11-09 ENCOUNTER — Telehealth: Payer: Self-pay | Admitting: Internal Medicine

## 2020-11-09 LAB — NOVEL CORONAVIRUS, NAA: SARS-CoV-2, NAA: NOT DETECTED

## 2020-11-09 NOTE — Telephone Encounter (Signed)
Patient called asking to talk to you because she have some questions about a mammogram screening she recently got. Patient stated that she received a letter letting her know that her mammogram is abnormal and that she has another appointment with a surgeon. Patient would like to discussed with you any ideas of costs and payments so she can start planning for it.   I recommend the patient to get in touch with the surgeon office to discuss payment methods and also mention to them that she has the pink car.

## 2020-11-20 ENCOUNTER — Other Ambulatory Visit: Payer: Self-pay

## 2020-11-20 ENCOUNTER — Other Ambulatory Visit: Payer: Self-pay | Admitting: *Deleted

## 2020-11-20 DIAGNOSIS — Z124 Encounter for screening for malignant neoplasm of cervix: Secondary | ICD-10-CM

## 2020-11-20 NOTE — Addendum Note (Signed)
Addended by: Royston Bake on: 11/20/2020 08:49 AM   Modules accepted: Orders

## 2020-11-20 NOTE — Progress Notes (Addendum)
Patient: Madison Adkins           Date of Birth: [birth year]           MRN: [id number] Visit Date: 11/20/2020 PCP: Mack Hook, MD  Cervical Cancer Screening Do you smoke?: No Have you ever had or been told you have an allergy to latex products?: No (no) Marital status: Divorce Date of last pap smear: 2-5 yrs ago (3 years ago) Date of last menstrual period:  (postmenopausal) Number of pregnancies: 2 Number of births: 2 Have you ever had any of the following? Hysterectomy: No Tubal ligation (tubes tied): No Abnormal bleeding: No Abnormal pap smear: No Venereal warts: No A sex partner with venereal warts: No A high risk* sex partner: No  Cervical Exam  Abnormal Observations: Normal Exam Recommendations: Last Pap smear was 11/21/2017 at [facility] and normal. Per patient has a history of an abnormal Pap smear in 2001 that thinks a colposcopy was completed for follow-up. Per patient has had at least three normal Pap smears since abnormal. Last Pap smear result is available in Epic. Let patient know if today's Pap smear is normal and HPV negative that her next Pap smear is due in 5 years. Let patient know that she will receive the results of her Pap smear by letter or phone within the next couple of weeks.    Transportation provided to appointment and home after appointment.  Patient's History Patient Active Problem List   Diagnosis Date Noted  . Hypercholesterolemia 09/02/2018  . Hyperlipidemia 2019  . Insomnia 08/24/2017  . Food insecurity 08/24/2017  . Abnormal Pap smear of cervix 10/28/2001   Past Medical History:  Diagnosis Date  . Abnormal Pap smear of cervix 2003   [facility]  . Hyperlipidemia 2019    Family History  Problem Relation Age of Onset  . Cancer Sister        Breast  . Breast cancer Sister   . Allergies Daughter   . Cancer Daughter        Ovarian cancer--died 2019/09/10.    Social History   Occupational History  . Occupation: Administrator  Tobacco Use  . Smoking status: Never Smoker  . Smokeless tobacco: Never Used  Vaping Use  . Vaping Use: Never used  Substance and Sexual Activity  . Alcohol use: Not Currently    Comment: rarely  . Drug use: No  . Sexual activity: Not Currently    Birth control/protection: Post-menopausal

## 2020-11-22 ENCOUNTER — Telehealth: Payer: Self-pay

## 2020-11-22 LAB — CYTOLOGY - PAP
Comment: NEGATIVE
Diagnosis: NEGATIVE
High risk HPV: NEGATIVE

## 2020-11-22 NOTE — Telephone Encounter (Signed)
Informed patient that her pap smear was normal and the HPV was negative. Next pap smear will be due in 5 years. Patient voiced understanding.

## 2020-12-11 ENCOUNTER — Other Ambulatory Visit: Payer: Self-pay | Admitting: General Surgery

## 2020-12-11 DIAGNOSIS — R928 Other abnormal and inconclusive findings on diagnostic imaging of breast: Secondary | ICD-10-CM

## 2020-12-12 ENCOUNTER — Other Ambulatory Visit: Payer: Self-pay | Admitting: General Surgery

## 2020-12-12 DIAGNOSIS — R928 Other abnormal and inconclusive findings on diagnostic imaging of breast: Secondary | ICD-10-CM

## 2020-12-21 ENCOUNTER — Telehealth: Payer: Self-pay | Admitting: *Deleted

## 2020-12-21 NOTE — Telephone Encounter (Signed)
Patient called and left voicemail with questions about the billing for her breast surgery. Explained to patient that [facility] will cover the charges for the surgeon fees. Let patient know that [facility] doesn't cover the facility fees for her surgery. Explained the [place] assistance and patient stated she has an application. Patient stated she was told the charges needed to exceed $5000 to be eligible for the financial assistance. Told patient she will need to call the phone number on the application if she has any questions about qualifications and completing the application. Let patient know that [facility] will cover $325 of the anesthesia charges. Patient asked the CPT codes and how many units of anesthesia she will need for her surgery. Told patient that I don't have that information and she can check with the surgeon. Patient verbalized understanding.

## 2021-01-03 ENCOUNTER — Encounter (HOSPITAL_BASED_OUTPATIENT_CLINIC_OR_DEPARTMENT_OTHER): Payer: Self-pay | Admitting: General Surgery

## 2021-01-03 ENCOUNTER — Other Ambulatory Visit: Payer: Self-pay

## 2021-01-06 ENCOUNTER — Other Ambulatory Visit (HOSPITAL_COMMUNITY)
Admission: RE | Admit: 2021-01-06 | Discharge: 2021-01-06 | Disposition: A | Discharge status: Home / Self Care | Payer: No Typology Code available for payment source | Source: Ambulatory Visit | Attending: General Surgery | Admitting: General Surgery

## 2021-01-06 DIAGNOSIS — Z20822 Contact with and (suspected) exposure to covid-19: Secondary | ICD-10-CM | POA: Insufficient documentation

## 2021-01-06 DIAGNOSIS — Z01812 Encounter for preprocedural laboratory examination: Secondary | ICD-10-CM | POA: Insufficient documentation

## 2021-01-06 LAB — SARS CORONAVIRUS 2 (TAT 6-24 HRS): SARS Coronavirus 2: NEGATIVE

## 2021-01-08 ENCOUNTER — Ambulatory Visit
Admission: RE | Admit: 2021-01-08 | Discharge: 2021-01-08 | Disposition: A | Discharge status: Home / Self Care | Payer: No Typology Code available for payment source | Source: Ambulatory Visit | Attending: General Surgery | Admitting: General Surgery

## 2021-01-08 ENCOUNTER — Other Ambulatory Visit: Payer: Self-pay

## 2021-01-08 DIAGNOSIS — R928 Other abnormal and inconclusive findings on diagnostic imaging of breast: Secondary | ICD-10-CM

## 2021-01-08 MED ORDER — CHLORHEXIDINE GLUCONATE CLOTH 2 % EX PADS: 6.0000 | MEDICATED_PAD | Freq: Once | CUTANEOUS | Status: DC

## 2021-01-08 NOTE — Progress Notes (Signed)
      Enhanced Recovery after Surgery for Orthopedics Enhanced Recovery after Surgery is a protocol used to improve the stress on your body and your recovery after surgery.  Patient Instructions  . The night before surgery:  o No food after midnight. ONLY clear liquids after midnight  . The day of surgery (if you do NOT have diabetes):  o Drink ONE (1) Pre-Surgery Clear Ensure as directed.   o This drink was given to you during your hospital  pre-op appointment visit. o The pre-op nurse will instruct you on the time to drink the  Pre-Surgery Ensure depending on your surgery time. o Finish the drink at the designated time by the pre-op nurse.  o Nothing else to drink after completing the  Pre-Surgery Clear Ensure.  . The day of surgery (if you have diabetes): o Drink ONE (1) Gatorade 2 (G2) as directed. o This drink was given to you during your hospital  pre-op appointment visit.  o The pre-op nurse will instruct you on the time to drink the   Gatorade 2 (G2) depending on your surgery time. o Color of the Gatorade may vary. Red is not allowed. o Nothing else to drink after completing the  Gatorade 2 (G2).         If you have questions, please contact your surgeon's office.  Surgical soap given to pt and RN explained how to use it.  Pt verbalized understanding.

## 2021-01-08 NOTE — H&P (Signed)
Madison Adkins Location: [facility] Patient #: [id number] DOB: [birth year] Divorced / Language: English / Race: Black or African American Female   History of Present Illness The patient is a 55 year old female who presents with a complaint of Breast problems. Pt is a 55 yo F who was found to have an abnormal left mammogram. Diagnostic mammogram was performed and showed a indeterminate mass vs debris in the lower central retroareolar breast. This was 9 mm in greatest dimension. She denies prior breast issues. She did have a sister dx with breast cancer in her 43s who died within 65 months of dx. Core needle biopsy showed epithelial lined cyst wtih fibrosis. This was felt to be discordant and excision was recommended. She is from [place] and has 43 yo twin daughters here [place]. Her sister was diagnosed and treated in [place].    left dx mammogram 09/30/2020 ACR Breast Density Category c: The breast tissue is heterogeneously dense, which may obscure small masses.  FINDINGS: Additional tomograms were performed of the left breast. There is a persistent somewhat tubular appearing asymmetry in the lower central to slightly inner left breast possibly related to a duct.  Mammographic images were processed with CAD.  Targeted ultrasound of the central left breast was performed. There is a focally dilated duct containing mass versus debris in the retroareolar left breast measuring 0.9 x 0.5 x 0.8 cm. This may correspond with the asymmetry seen in the left breast at mammography. Additional mildly dilated fluid-filled ducts are identified in the retroareolar left breast.  No lymphadenopathy seen in the left axilla.  IMPRESSION: Indeterminate 0.9 cm mass in the retroareolar left breast which may represent a focally dilated duct containing mass versus debris.  RECOMMENDATION: Recommend ultrasound-guided biopsy of the mass in the retroareolar left breast. This will be  scheduled for the patient.  I have discussed the findings and recommendations with the patient. If applicable, a reminder letter will be sent to the patient regarding the next appointment.  BI-RADS CATEGORY 4: Suspicious.   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 Medications Reconciled  Social History  Alcohol use  Occasional alcohol use. Caffeine use  Coffee. No drug use  Tobacco use  Never smoker.  Family History Family history unknown  First Degree Relatives   Pregnancy / Birth History  Age at menarche  16 years. Age of menopause  <46 Gravida  2 Length (months) of breastfeeding  7-12 Maternal age  32-20 Para  3    Review of Systems General Not Present- Appetite Loss, Chills, Fatigue, Fever, Night Sweats, Weight Gain and Weight Loss. Skin Not Present- Change in Wart/Mole, Dryness, Hives, Jaundice, New Lesions, Non-Healing Wounds, Rash and Ulcer. HEENT Present- Wears glasses/contact lenses. Not Present- Earache, Hearing Loss, Hoarseness, Nose Bleed, Oral Ulcers, Ringing in the Ears, Seasonal Allergies, Sinus Pain, Sore Throat, Visual Disturbances and Yellow Eyes. Respiratory Present- Snoring. Not Present- Bloody sputum, Chronic Cough, Difficulty Breathing and Wheezing. Cardiovascular Not Present- Chest Pain, Difficulty Breathing Lying Down, Leg Cramps, Palpitations, Rapid Heart Rate, Shortness  of Breath and Swelling of  Extremities. Gastrointestinal Not Present- Abdominal Pain, Bloating, Bloody Stool, Change in Bowel Habits, Chronic diarrhea, Constipation, Difficulty Swallowing, Excessive gas, Gets full quickly at meals, Hemorrhoids, Indigestion, Nausea, Rectal Pain and Vomiting. Female Genitourinary Not Present- Frequency, Nocturia, Painful Urination, Pelvic Pain and Urgency. Musculoskeletal Not Present- Back Pain, Joint Pain, Joint Stiffness, Muscle Pain, Muscle Weakness and Swelling of Extremities. Neurological Not Present- Decreased Memory, Fainting, Headaches, Numbness, Seizures, Tingling, Tremor, Trouble walking and Weakness. Psychiatric Not Present- Anxiety, Bipolar, Change in Sleep Pattern, Depression, Fearful and Frequent crying. Endocrine Not Present- Cold Intolerance, Excessive Hunger, Hair Changes, Heat Intolerance, Hot flashes and New Diabetes. Hematology Not Present- Blood Thinners, Easy Bruising, Excessive bleeding, Gland problems, HIV and Persistent Infections.  Vitals  Weight: 126.13 lb Height: 63in Body Surface Area: 1.59 m Body Mass Index: 22.34 kg/m  Temp.: 97.84F  Pulse: 108 (Regular)  P.OX: 97% (Room air) BP: 112/68(Sitting, Left Arm, Standard)       Physical Exam General Mental Status-Alert. General Appearance-Consistent with stated age. Hydration-Well hydrated. Voice-Normal.  Head and Neck Head-normocephalic, atraumatic with no lesions or palpable masses. Trachea-midline. Thyroid Gland Characteristics - normal size and consistency.  Eye Eyeball - Bilateral-Extraocular movements intact. Sclera/Conjunctiva - Bilateral-No scleral icterus.  Chest and Lung Exam Chest and lung exam reveals -quiet, even and easy respiratory effort with no use of accessory muscles and on auscultation, normal breath sounds, no adventitious sounds and normal vocal resonance. Inspection Chest Wall - Normal. Back - normal.  Breast Note: breasts are symmetric  bilaterally wtih moderate ptosis. No palpable masses. no nipple discharge or nipple retraction. No skin dimpling. No LAD.   Cardiovascular Cardiovascular examination reveals -normal heart sounds, regular rate and rhythm with no murmurs and normal pedal pulses bilaterally.  Abdomen Inspection Inspection of the abdomen reveals - No Hernias. Palpation/Percussion Palpation and Percussion of the abdomen reveal - Soft, Non Tender, No Rebound tenderness, No Rigidity (guarding) and No hepatosplenomegaly. Auscultation Auscultation of the abdomen reveals - Bowel sounds normal.  Neurologic Neurologic evaluation reveals -alert and oriented x 3 with no impairment of recent or remote memory. Mental Status-Normal.  Musculoskeletal Global Assessment -Note: no gross deformities.  Normal Exam - Left-Upper Extremity Strength Normal and Lower Extremity Strength Normal. Normal Exam - Right-Upper Extremity Strength Normal and Lower Extremity Strength Normal.  Lymphatic Head & Neck  General Head & Neck Lymphatics: Bilateral - Description - Normal. Axillary  General Axillary Region: Bilateral - Description - Normal. Tenderness - Non Tender. Femoral & Inguinal  Generalized Femoral & Inguinal Lymphatics: Bilateral - Description - No Generalized lymphadenopathy.    Assessment & Plan ABNORMAL MAMMOGRAM OF LEFT BREAST (R92.8) Impression: Pt has an abnormal mammogram wtih a discordant biopsy. Will plan seed localized excisional biopsy.  The surgical procedure was described to the patient. I discussed the incision type and location and that we would need radiology involved on with a wire or seed marker and/or sentinel node.  The risks and benefits of the procedure were described to the patient and she wishes to proceed.  We discussed the risks bleeding, infection, damage to other structures, need for further procedures/surgeries. We discussed the risk of seroma. The patient was advised if  the area in the breast in cancer, we may need to go back to surgery for additional tissue to obtain negative margins or for a lymph node biopsy. The patient was advised that these are the most common complications, but that others can occur as well. They were advised against  taking aspirin or other anti-inflammatory agents/blood thinners the week before surgery. Current Plans Pt Education - [facility] Breast Biopsy HCI: discussed with patient and provided information. You are being scheduled for surgery- Our schedulers will call you.  You should hear from our office's scheduling department within 5 working days about the location, date, and time of surgery. We try to make accommodations for patient's preferences in scheduling surgery, but sometimes the OR schedule or the surgeon's schedule prevents [place] from making those accommodations.  If you have not heard from our office [phone]) in 5 working days, call the office and ask for your surgeon's nurse.  If you have other questions about your diagnosis, plan, or surgery, call the office and ask for your surgeon's nurse.

## 2021-01-10 ENCOUNTER — Ambulatory Visit (HOSPITAL_BASED_OUTPATIENT_CLINIC_OR_DEPARTMENT_OTHER): Payer: No Typology Code available for payment source | Admitting: Anesthesiology

## 2021-01-10 ENCOUNTER — Encounter (HOSPITAL_BASED_OUTPATIENT_CLINIC_OR_DEPARTMENT_OTHER): Payer: Self-pay | Admitting: General Surgery

## 2021-01-10 ENCOUNTER — Other Ambulatory Visit: Payer: Self-pay

## 2021-01-10 ENCOUNTER — Encounter (HOSPITAL_BASED_OUTPATIENT_CLINIC_OR_DEPARTMENT_OTHER)
Admission: RE | Disposition: A | Discharge status: Home / Self Care | Payer: Self-pay | Source: Home / Self Care | Attending: General Surgery

## 2021-01-10 ENCOUNTER — Ambulatory Visit
Admission: RE | Admit: 2021-01-10 | Discharge: 2021-01-10 | Disposition: A | Discharge status: Home / Self Care | Payer: No Typology Code available for payment source | Source: Ambulatory Visit | Attending: General Surgery | Admitting: General Surgery

## 2021-01-10 ENCOUNTER — Ambulatory Visit (HOSPITAL_BASED_OUTPATIENT_CLINIC_OR_DEPARTMENT_OTHER)
Admission: RE | Admit: 2021-01-10 | Discharge: 2021-01-10 | Disposition: A | Discharge status: Home / Self Care | Payer: No Typology Code available for payment source | Attending: General Surgery | Admitting: General Surgery

## 2021-01-10 DIAGNOSIS — N62 Hypertrophy of breast: Secondary | ICD-10-CM | POA: Insufficient documentation

## 2021-01-10 DIAGNOSIS — N6012 Diffuse cystic mastopathy of left breast: Secondary | ICD-10-CM

## 2021-01-10 DIAGNOSIS — Z803 Family history of malignant neoplasm of breast: Secondary | ICD-10-CM | POA: Insufficient documentation

## 2021-01-10 DIAGNOSIS — R928 Other abnormal and inconclusive findings on diagnostic imaging of breast: Secondary | ICD-10-CM

## 2021-01-10 HISTORY — PX: RADIOACTIVE SEED GUIDED EXCISIONAL BREAST BIOPSY: SHX6490

## 2021-01-10 HISTORY — DX: Other abnormal and inconclusive findings on diagnostic imaging of breast: R92.8

## 2021-01-10 HISTORY — DX: Diffuse cystic mastopathy of left breast: N60.12

## 2021-01-10 SURGERY — RADIOACTIVE SEED GUIDED BREAST BIOPSY
Anesthesia: General | Site: Breast | Laterality: Left

## 2021-01-10 MED ORDER — ONDANSETRON HCL 4 MG/2ML IJ SOLN
INTRAMUSCULAR | Status: AC
  Filled 2021-01-10: qty 2

## 2021-01-10 MED ORDER — CEFAZOLIN SODIUM-DEXTROSE 2-4 GM/100ML-% IV SOLN
INTRAVENOUS | Status: AC
  Filled 2021-01-10: qty 100

## 2021-01-10 MED ORDER — GABAPENTIN 100 MG PO CAPS
ORAL_CAPSULE | ORAL | Status: AC
  Filled 2021-01-10: qty 1

## 2021-01-10 MED ORDER — FENTANYL CITRATE (PF) 100 MCG/2ML IJ SOLN
INTRAMUSCULAR | Status: DC | PRN
  Administered 2021-01-10 (×2): 25 ug via INTRAVENOUS
  Administered 2021-01-10: 50 ug via INTRAVENOUS

## 2021-01-10 MED ORDER — PROPOFOL 10 MG/ML IV BOLUS
INTRAVENOUS | Status: DC | PRN
  Administered 2021-01-10: 130 mg via INTRAVENOUS

## 2021-01-10 MED ORDER — MIDAZOLAM HCL 2 MG/2ML IJ SOLN
INTRAMUSCULAR | Status: AC
  Filled 2021-01-10: qty 2

## 2021-01-10 MED ORDER — EPHEDRINE 5 MG/ML INJ
INTRAVENOUS | Status: AC
  Filled 2021-01-10: qty 10

## 2021-01-10 MED ORDER — CEFAZOLIN SODIUM-DEXTROSE 2-4 GM/100ML-% IV SOLN
2.0000 g | INTRAVENOUS | Status: AC
  Administered 2021-01-10: 2 g via INTRAVENOUS

## 2021-01-10 MED ORDER — ACETAMINOPHEN 500 MG PO TABS
ORAL_TABLET | ORAL | Status: AC
  Filled 2021-01-10: qty 2

## 2021-01-10 MED ORDER — LIDOCAINE HCL 1 % IJ SOLN
INTRAMUSCULAR | Status: DC | PRN
  Administered 2021-01-10: 30 mL via INTRAMUSCULAR

## 2021-01-10 MED ORDER — ACETAMINOPHEN 500 MG PO TABS
1000.0000 mg | ORAL_TABLET | ORAL | Status: AC
  Administered 2021-01-10: 1000 mg via ORAL

## 2021-01-10 MED ORDER — DEXAMETHASONE SODIUM PHOSPHATE 10 MG/ML IJ SOLN
INTRAMUSCULAR | Status: DC | PRN
  Administered 2021-01-10: 5 mg via INTRAVENOUS

## 2021-01-10 MED ORDER — OXYCODONE HCL 5 MG PO TABS: 5.0000 mg | ORAL_TABLET | Freq: Four times a day (QID) | ORAL | 0 refills | Status: DC | PRN

## 2021-01-10 MED ORDER — PROPOFOL 10 MG/ML IV BOLUS
INTRAVENOUS | Status: AC
  Filled 2021-01-10: qty 20

## 2021-01-10 MED ORDER — EPHEDRINE SULFATE 50 MG/ML IJ SOLN
INTRAMUSCULAR | Status: DC | PRN
  Administered 2021-01-10 (×2): 10 mg via INTRAVENOUS

## 2021-01-10 MED ORDER — MIDAZOLAM HCL 5 MG/5ML IJ SOLN
INTRAMUSCULAR | Status: DC | PRN
  Administered 2021-01-10: 2 mg via INTRAVENOUS

## 2021-01-10 MED ORDER — LACTATED RINGERS IV SOLN: INTRAVENOUS | Status: DC

## 2021-01-10 MED ORDER — HYDROMORPHONE HCL 1 MG/ML IJ SOLN
0.2500 mg | INTRAMUSCULAR | Status: DC | PRN
Start: 2021-01-10 — End: 2021-01-10

## 2021-01-10 MED ORDER — LIDOCAINE HCL (CARDIAC) PF 100 MG/5ML IV SOSY
PREFILLED_SYRINGE | INTRAVENOUS | Status: DC | PRN
  Administered 2021-01-10: 60 mg via INTRATRACHEAL

## 2021-01-10 MED ORDER — GABAPENTIN 100 MG PO CAPS
100.0000 mg | ORAL_CAPSULE | ORAL | Status: AC
  Administered 2021-01-10: 100 mg via ORAL

## 2021-01-10 MED ORDER — FENTANYL CITRATE (PF) 100 MCG/2ML IJ SOLN
INTRAMUSCULAR | Status: AC
  Filled 2021-01-10: qty 2

## 2021-01-10 SURGICAL SUPPLY — 40 items
BINDER BREAST MEDIUM (GAUZE/BANDAGES/DRESSINGS) ×2 IMPLANT
BLADE SURG 10 STRL SS (BLADE) ×2 IMPLANT
CANISTER SUCT 1200ML W/VALVE (MISCELLANEOUS) ×2 IMPLANT
CHLORAPREP W/TINT 26 (MISCELLANEOUS) ×2 IMPLANT
CLIP VESOCCLUDE LG 6/CT (CLIP) ×2 IMPLANT
COVER BACK TABLE 60X90IN (DRAPES) ×2 IMPLANT
COVER MAYO STAND STRL (DRAPES) ×2 IMPLANT
COVER PROBE W GEL 5X96 (DRAPES) ×2 IMPLANT
DERMABOND ADVANCED (GAUZE/BANDAGES/DRESSINGS) ×1
DERMABOND ADVANCED .7 DNX12 (GAUZE/BANDAGES/DRESSINGS) ×1 IMPLANT
DRAPE LAPAROSCOPIC ABDOMINAL (DRAPES) ×2 IMPLANT
DRAPE UTILITY XL STRL (DRAPES) ×2 IMPLANT
ELECT COATED BLADE 2.86 ST (ELECTRODE) ×2 IMPLANT
ELECT REM PT RETURN 9FT ADLT (ELECTROSURGICAL) ×2
ELECTRODE REM PT RTRN 9FT ADLT (ELECTROSURGICAL) ×1 IMPLANT
GAUZE SPONGE 4X4 12PLY STRL LF (GAUZE/BANDAGES/DRESSINGS) ×2 IMPLANT
GLOVE SURG ENC MOIS LTX SZ6 (GLOVE) ×2 IMPLANT
GLOVE SURG ENC MOIS LTX SZ6.5 (GLOVE) ×4 IMPLANT
GLOVE SURG UNDER POLY LF SZ6.5 (GLOVE) ×2 IMPLANT
GLOVE SURG UNDER POLY LF SZ7 (GLOVE) ×2 IMPLANT
GOWN STRL REUS W/ TWL LRG LVL3 (GOWN DISPOSABLE) ×1 IMPLANT
GOWN STRL REUS W/TWL 2XL LVL3 (GOWN DISPOSABLE) ×2 IMPLANT
GOWN STRL REUS W/TWL LRG LVL3 (GOWN DISPOSABLE) ×1
KIT MARKER MARGIN INK (KITS) ×2 IMPLANT
NEEDLE HYPO 25X1 1.5 SAFETY (NEEDLE) ×2 IMPLANT
NS IRRIG 1000ML POUR BTL (IV SOLUTION) ×2 IMPLANT
PACK BASIN DAY SURGERY FS (CUSTOM PROCEDURE TRAY) ×2 IMPLANT
PENCIL SMOKE EVACUATOR (MISCELLANEOUS) ×2 IMPLANT
SLEEVE SCD COMPRESS KNEE MED (STOCKING) ×2 IMPLANT
SPONGE LAP 18X18 RF (DISPOSABLE) ×2 IMPLANT
STRIP CLOSURE SKIN 1/2X4 (GAUZE/BANDAGES/DRESSINGS) ×2 IMPLANT
SUT MON AB 4-0 PC3 18 (SUTURE) ×2 IMPLANT
SUT VIC AB 3-0 SH 27 (SUTURE) ×1
SUT VIC AB 3-0 SH 27X BRD (SUTURE) ×1 IMPLANT
SYR BULB EAR ULCER 3OZ GRN STR (SYRINGE) ×2 IMPLANT
SYR CONTROL 10ML LL (SYRINGE) ×2 IMPLANT
TOWEL GREEN STERILE FF (TOWEL DISPOSABLE) ×2 IMPLANT
TRAY FAXITRON CT DISP (TRAY / TRAY PROCEDURE) ×2 IMPLANT
TUBE CONNECTING 20X1/4 (TUBING) ×2 IMPLANT
YANKAUER SUCT BULB TIP NO VENT (SUCTIONS) ×2 IMPLANT

## 2021-01-10 NOTE — Anesthesia Preprocedure Evaluation (Addendum)
Anesthesia Evaluation  Patient identified by MRN, date of birth, ID band Patient awake    Reviewed: Allergy & Precautions, H&P , NPO status , Patient's Chart, lab work & pertinent test results  Airway Mallampati: II  TM Distance: >3 FB Neck ROM: Full    Dental no notable dental hx. (+) Teeth Intact, Dental Advisory Given   Pulmonary neg pulmonary ROS,    Pulmonary exam normal breath sounds clear to auscultation       Cardiovascular negative cardio ROS   Rhythm:Regular Rate:Normal     Neuro/Psych negative neurological ROS  negative psych ROS   GI/Hepatic negative GI ROS, Neg liver ROS,   Endo/Other  negative endocrine ROS  Renal/GU negative Renal ROS  negative genitourinary   Musculoskeletal   Abdominal   Peds  Hematology negative hematology ROS (+)   Anesthesia Other Findings   Reproductive/Obstetrics negative OB ROS                            Anesthesia Physical Anesthesia Plan  ASA: II  Anesthesia Plan: General   Post-op Pain Management:    Induction: Intravenous  PONV Risk Score and Plan: 4 or greater and Ondansetron, Dexamethasone and Midazolam  Airway Management Planned: LMA  Additional Equipment:   Intra-op Plan:   Post-operative Plan: Extubation in OR  Informed Consent: I have reviewed the patients History and Physical, chart, labs and discussed the procedure including the risks, benefits and alternatives for the proposed anesthesia with the patient or authorized representative who has indicated his/her understanding and acceptance.     Dental advisory given  Plan Discussed with: CRNA  Anesthesia Plan Comments:         Anesthesia Quick Evaluation

## 2021-01-10 NOTE — Discharge Instructions (Addendum)
[place] Office Phone Number [phone]  BREAST BIOPSY/ PARTIAL MASTECTOMY: POST OP INSTRUCTIONS  Always review your discharge instruction sheet given to you by the facility where your surgery was performed.  IF YOU HAVE DISABILITY OR FAMILY LEAVE FORMS, YOU MUST BRING THEM TO THE OFFICE FOR PROCESSING.  DO NOT GIVE THEM TO YOUR DOCTOR.  1. A prescription for pain medication may be given to you upon discharge.  Take your pain medication as prescribed, if needed.  If narcotic pain medicine is not needed, then you may take acetaminophen (Tylenol) or ibuprofen (Advil) as needed. 2. Take your usually prescribed medications unless otherwise directed 3. If you need a refill on your pain medication, please contact your pharmacy.  They will contact our office to request authorization.  Prescriptions will not be filled after 5pm or on week-ends. 4. You should eat very light the first 24 hours after surgery, such as soup, crackers, pudding, etc.  Resume your normal diet the day after surgery. 5. Most patients will experience some swelling and bruising in the breast.  Ice packs and a good support bra will help.  Swelling and bruising can take several days to resolve.  6. It is common to experience some constipation if taking pain medication after surgery.  Increasing fluid intake and taking a stool softener will usually help or prevent this problem from occurring.  A mild laxative (Milk of Magnesia or Miralax) should be taken according to package directions if there are no bowel movements after 48 hours. 7. Unless discharge instructions indicate otherwise, you may remove your bandages 48 hours after surgery, and you may shower at that time.  You may have steri-strips (small skin tapes) in place directly over the incision.  These strips should be left on the skin for 7-10 days.   Any sutures or staples will be removed at the office during your follow-up visit. 8. ACTIVITIES:  You may resume  regular daily activities (gradually increasing) beginning the next day.  Wearing a good support bra or sports bra (or the breast binder) minimizes pain and swelling.  You may have sexual intercourse when it is comfortable. a. You may drive when you no longer are taking prescription pain medication, you can comfortably wear a seatbelt, and you can safely maneuver your car and apply brakes. b. RETURN TO WORK:  __________1 week_______________ 9. You should see your doctor in the office for a follow-up appointment approximately two weeks after your surgery.  Your doctors nurse will typically make your follow-up appointment when she calls you with your pathology report.  Expect your pathology report 2-3 business days after your surgery.  You may call to check if you do not hear from [place] after three days.   WHEN TO CALL YOUR DOCTOR: 1. Fever over 101.0 2. Nausea and/or vomiting. 3. Extreme swelling or bruising. 4. Continued bleeding from incision. 5. Increased pain, redness, or drainage from the incision.  The clinic staff is available to answer your questions during regular business hours.  Please dont hesitate to call and ask to speak to one of the nurses for clinical concerns.  If you have a medical emergency, go to the nearest emergency room or call 911.  A surgeon from [facility] is always on call at the hospital.  For further questions, please visit [email or web address]  May take Tylenol after 3pm, if needed.    Post Anesthesia Home Care Instructions  Activity: Get plenty of rest for the remainder of the day. A  responsible individual must stay with you for 24 hours following the procedure.  For the next 24 hours, DO NOT: -Drive a car -Paediatric nurse -Drink alcoholic beverages -Take any medication unless instructed by your physician -Make any legal decisions or sign important papers.  Meals: Start with liquid foods such as gelatin or soup. Progress to regular  foods as tolerated. Avoid greasy, spicy, heavy foods. If nausea and/or vomiting occur, drink only clear liquids until the nausea and/or vomiting subsides. Call your physician if vomiting continues.  Special Instructions/Symptoms: Your throat may feel dry or sore from the anesthesia or the breathing tube placed in your throat during surgery. If this causes discomfort, gargle with warm salt water. The discomfort should disappear within 24 hours.  If you had a scopolamine patch placed behind your ear for the management of post- operative nausea and/or vomiting:  1. The medication in the patch is effective for 72 hours, after which it should be removed.  Wrap patch in a tissue and discard in the trash. Wash hands thoroughly with soap and water. 2. You may remove the patch earlier than 72 hours if you experience unpleasant side effects which may include dry mouth, dizziness or visual disturbances. 3. Avoid touching the patch. Wash your hands with soap and water after contact with the patch.

## 2021-01-10 NOTE — Anesthesia Postprocedure Evaluation (Signed)
Anesthesia Post Note  Patient: Madison Adkins  Procedure(s) Performed: LEFT BREAST SEED LOCALIZED EXCISIONAL BIOPSY (Left Breast)     Patient location during evaluation: PACU Anesthesia Type: General Level of consciousness: awake and alert Pain management: pain level controlled Vital Signs Assessment: post-procedure vital signs reviewed and stable Respiratory status: spontaneous breathing, nonlabored ventilation and respiratory function stable Cardiovascular status: blood pressure returned to baseline and stable Postop Assessment: no apparent nausea or vomiting Anesthetic complications: no   No complications documented.  Last Vitals:  Vitals:   01/10/21 1127 01/10/21 1145  BP:  124/77  Pulse: 90 82  Resp: 15 16  Temp:  36.6 C  SpO2: 100% 98%    Last Pain:  Vitals:   01/10/21 1145  TempSrc:   PainSc: 0-No pain                 Myrian Botello,W. EDMOND

## 2021-01-10 NOTE — Op Note (Signed)
left Breast Radioactive seed localized excisional biopsy  Indications: This patient presents with history of abnormal left mammogram with discordant core needle biopsy.    Pre-operative Diagnosis: abnormal left mammogram    Post-operative Diagnosis: abnormal left mammogram  Surgeon: Stark Klein   Anesthesia: General endotracheal anesthesia  ASA Class: 2  Procedure Details  The patient was seen in the Holding Room. The risks, benefits, complications, treatment options, and expected outcomes were discussed with the patient. The possibilities of bleeding, infection, the need for additional procedures, failure to diagnose a condition, and creating a complication requiring transfusion or operation were discussed with the patient. The patient concurred with the proposed plan, giving informed consent.  The site of surgery properly noted/marked. The patient was taken to Operating Room # 5, identified, and the procedure verified as left Breast seed localized excisional biopsy. A Time Out was held and the above information confirmed.  The left breast and chest were prepped and draped in standard fashion. A inferior circumareolar incision was made near the previously placed radioactive seed.  Dissection was carried down around the point of maximum signal intensity. The cautery was used to perform the dissection.   The specimen was inked with the margin marker paint kit.    Specimen radiography confirmed inclusion of the mammographic lesion, the clip, and the seed.  The background signal in the breast was zero.   Hemostasis was achieved with cautery.  The wound was irrigated and closed with 3-0 vicryl interrupted deep dermal sutures and 4-0 monocryl running subcuticular suture.      Sterile dressings were applied. At the end of the operation, all sponge, instrument, and needle counts were correct.  Findings: Seed, clip in specimen.  Anterior margin is skin.  Estimated Blood Loss:  min          Specimens: left breast tissue with seed         Complications:  None; patient tolerated the procedure well.         Disposition: PACU - hemodynamically stable.         Condition: stable

## 2021-01-10 NOTE — Transfer of Care (Signed)
Immediate Anesthesia Transfer of Care Note  Patient: Jatia Carline  Procedure(s) Performed: LEFT BREAST SEED LOCALIZED EXCISIONAL BIOPSY (Left Breast)  Patient Location: PACU  Anesthesia Type:General  Level of Consciousness: sedated  Airway & Oxygen Therapy: Patient Spontanous Breathing and Patient connected to face mask oxygen  Post-op Assessment: Report given to RN and Post -op Vital signs reviewed and stable  Post vital signs: Reviewed and stable  Last Vitals:  Vitals Value Taken Time  BP 115/64 01/10/21 1110  Temp    Pulse 91 01/10/21 1111  Resp 14 01/10/21 1111  SpO2 100 % 01/10/21 1111  Vitals shown include unvalidated device data.  Last Pain:  Vitals:   01/10/21 0847  TempSrc: Oral  PainSc: 2       Patients Stated Pain Goal: 5 (27/87/18 3672)  Complications: No complications documented.

## 2021-01-10 NOTE — Interval H&P Note (Signed)
History and Physical Interval Note:  01/10/2021 9:09 AM  Madison Adkins  has presented today for surgery, with the diagnosis of LEFT ABNORMAL MAMMOGRAM.  The various methods of treatment have been discussed with the patient and family. After consideration of risks, benefits and other options for treatment, the patient has consented to  Procedure(s) with comments: LEFT BREAST SEED LOCALIZED EXCISIONAL BIOPSY (Left) - START TIME OF 10:15 AM FOR [facility] as a surgical intervention.  The patient's history has been reviewed, patient examined, no change in status, stable for surgery.  I have reviewed the patient's chart and labs.  Questions were answered to the patient's satisfaction.     Stark Klein

## 2021-01-10 NOTE — Anesthesia Procedure Notes (Signed)
Procedure Name: LMA Insertion Date/Time: 01/10/2021 10:21 AM Performed by: Glory Buff, CRNA Pre-anesthesia Checklist: Patient identified, Emergency Drugs available, Suction available and Patient being monitored Patient Re-evaluated:Patient Re-evaluated prior to induction Oxygen Delivery Method: Circle system utilized Preoxygenation: Pre-oxygenation with 100% oxygen Induction Type: IV induction Ventilation: Mask ventilation without difficulty LMA: LMA inserted LMA Size: 4.0 Number of attempts: 1 Placement Confirmation: positive ETCO2 Tube secured with: Tape Dental Injury: Teeth and Oropharynx as per pre-operative assessment

## 2021-01-12 ENCOUNTER — Encounter (HOSPITAL_BASED_OUTPATIENT_CLINIC_OR_DEPARTMENT_OTHER): Payer: Self-pay | Admitting: General Surgery

## 2021-01-15 LAB — SURGICAL PATHOLOGY

## 2021-02-26 ENCOUNTER — Telehealth: Payer: Self-pay | Admitting: *Deleted

## 2021-02-26 NOTE — Telephone Encounter (Signed)
Patient called today stating she received another bill for her anesthesia. Explained to patient that I received the one she sent and payment was submitted for $325 on 02/14/2021. Informed patient that it can take a few weeks before the payment has cleared. Told patient if it has not cleared in a week to give me a call back. Explained to patient that she is responsible for the remaining balance that [facility] will only cover $325. Told patient that she will need to contact [employer] directly for financial assistance and/or payment options. Patient verbalized understanding.

## 2021-07-27 ENCOUNTER — Encounter: Payer: Self-pay | Admitting: Internal Medicine

## 2021-07-27 ENCOUNTER — Ambulatory Visit: Payer: Self-pay | Admitting: Internal Medicine

## 2021-07-27 ENCOUNTER — Other Ambulatory Visit: Payer: Self-pay

## 2021-07-27 VITALS — BP 112/82 | HR 72 | Resp 12 | Ht 63.0 in | Wt 124.0 lb

## 2021-07-27 DIAGNOSIS — Z1231 Encounter for screening mammogram for malignant neoplasm of breast: Secondary | ICD-10-CM

## 2021-07-27 DIAGNOSIS — Z114 Encounter for screening for human immunodeficiency virus [HIV]: Secondary | ICD-10-CM

## 2021-07-27 DIAGNOSIS — Z1159 Encounter for screening for other viral diseases: Secondary | ICD-10-CM

## 2021-07-27 DIAGNOSIS — Z0000 Encounter for general adult medical examination without abnormal findings: Secondary | ICD-10-CM

## 2021-07-27 DIAGNOSIS — R928 Other abnormal and inconclusive findings on diagnostic imaging of breast: Secondary | ICD-10-CM | POA: Insufficient documentation

## 2021-07-27 DIAGNOSIS — H547 Unspecified visual loss: Secondary | ICD-10-CM

## 2021-07-27 DIAGNOSIS — Z5941 Food insecurity: Secondary | ICD-10-CM

## 2021-07-27 DIAGNOSIS — E7800 Pure hypercholesterolemia, unspecified: Secondary | ICD-10-CM

## 2021-07-27 DIAGNOSIS — Z23 Encounter for immunization: Secondary | ICD-10-CM

## 2021-07-27 DIAGNOSIS — Z9189 Other specified personal risk factors, not elsewhere classified: Secondary | ICD-10-CM

## 2021-07-27 MED ORDER — CALCIUM CITRATE-VITAMIN D 250-200 MG-UNIT PO TABS: ORAL_TABLET | ORAL | Status: DC

## 2021-07-27 NOTE — Progress Notes (Signed)
Subjective:    Patient ID: Madison Adkins, female   DOB: 05-16-1966, 55 y.o.   MRN: [id number]   HPI  CPE without pap  1.  Pap:  Had normal pap with [facility] exam in January 2022.    2.  Mammogram:  underwent left breast excisional breast biopsy on 01/10/2021 with findings of ductal hyperplasia/periductal inflammation and fibrocystic breast disease, all benign.  She is to have repeat mammogram in Novermber.  3.  Osteoprevention:  Does take Vitamin D, but not calcium.  Not much in way of dairy.  In a hiking group downtown and hikes on the weekend, Fri, Sat, Sun.    4.  Guaiac Cards/FIT:  Last in 2020 and negative for blood.  5.  Colonoscopy:  Never.  No family history of colon cancer.  6.  Immunizations:   Immunization History  Administered Date(s) Administered   Influenza-Unspecified 08/25/2018   PFIZER(Purple Top)SARS-COV-2 Vaccination 02/03/2020, 02/28/2020, 10/24/2020   Td 08/28/2009   Tdap 12/16/2018     7.  Glucose/Cholesterol :  Blood glucose fine in past.  Cholesterol/LDL always mildly elevated, but HDL very high. Lipid Panel     Component Value Date/Time   CHOL 240 (H) 07/26/2020 1517   TRIG 65 07/26/2020 1517   HDL 92 07/26/2020 1517   LDLCALC 137 (H) 07/26/2020 1517   LABVLDL 11 07/26/2020 1517     Current Meds  Medication Sig   VITAMIN D, ERGOCALCIFEROL, PO Take by mouth. occasionally   No Known Allergies  Past Medical History:  Diagnosis Date   Abnormal mammogram 09/19/2020   Benign findings ultimately on left breast excisional biopsy 12/2020   Abnormal Pap smear of cervix 2003   [facility]   Fibrocystic breast disease, left 01/10/2021   Hyperlipidemia 2019   Very high HDL however.    Past Surgical History:  Procedure Laterality Date   RADIOACTIVE SEED GUIDED EXCISIONAL BREAST BIOPSY Left 01/10/2021   Procedure: LEFT BREAST SEED LOCALIZED EXCISIONAL BIOPSY;  Surgeon: Stark Klein, MD;  Location: [place];  Service:  General;  Laterality: Left;   Family History  Problem Relation Age of Onset   Cancer Sister        Breast   Breast cancer Sister    Allergies Daughter    Cancer Daughter        Ovarian cancer--died 2019/09/27.   Social History   Socioeconomic History   Marital status: Divorced    Spouse name: Not on file   Number of children: 3   Years of education: 12+   Highest education level: Some college, no degree  Occupational History   Occupation: Development worker, community  Tobacco Use   Smoking status: Never   Smokeless tobacco: Never  Vaping Use   Vaping Use: Never used  Substance and Sexual Activity   Alcohol use: Not Currently    Comment: Rare previously, but stopped altogether end of 2021.   Drug use: No   Sexual activity: Not Currently    Birth control/protection: Post-menopausal  Other Topics Concern   Not on file  Social History Narrative   Originally from [place]   Came to [place] In [place]   Lives with her 2 daughters.   Father not involved.   Goes to [employer] in downtown   Would like to go to college.  She is waiting until her daughters are 78 yo.    Social Determinants of Health   Financial Resource Strain: Medium Risk   Difficulty  of Paying Living Expenses: Somewhat hard  Food Insecurity: Food Insecurity Present   Worried About Charity fundraiser in the Last Year: Often true   Ran Out of Food in the Last Year: Often true  Transportation Needs: Public librarian (Medical): Yes   Lack of Transportation (Non-Medical): Yes  Physical Activity: Not on file  Stress: Not on file  Social Connections: Not on file  Intimate Partner Violence: Not At Risk   Fear of Current or Ex-Partner: No   Emotionally Abused: No   Physically Abused: No   Sexually Abused: No     Review of Systems  HENT:  Negative for dental problem.   Respiratory:  Negative for shortness of breath.   Cardiovascular:  Negative for chest pain, palpitations and leg swelling.   Gastrointestinal:  Negative for blood in stool (No melena).     Objective:   BP 112/82 (BP Location: Left Arm, Patient Position: Sitting, Cuff Size: Normal)   Pulse 72   Resp 12   Ht 5\' 3"  (1.6 m)   Wt 124 lb (56.2 kg)   LMP 09/11/2011   BMI 21.97 kg/m   Physical Exam Constitutional:      Appearance: Normal appearance. She is normal weight.  HENT:     Head: Normocephalic and atraumatic.     Right Ear: Tympanic membrane, ear canal and external ear normal.     Left Ear: Tympanic membrane, ear canal and external ear normal.     Nose: Nose normal.     Mouth/Throat:     Mouth: Mucous membranes are moist.     Pharynx: Oropharynx is clear.  Eyes:     Extraocular Movements: Extraocular movements intact.     Conjunctiva/sclera: Conjunctivae normal.     Pupils: Pupils are equal, round, and reactive to light.     Comments: Discs sharp bilaterally.  Neck:     Thyroid: No thyroid mass or thyromegaly.  Cardiovascular:     Rate and Rhythm: Normal rate and regular rhythm.     Heart sounds: S1 normal and S2 normal. No murmur heard.   No friction rub. No S3 or S4 sounds.     Comments: No carotid bruits.  Carotid, radial, femoral, DP and PT pulses normal and equal.    Pulmonary:     Effort: Pulmonary effort is normal.     Breath sounds: Normal breath sounds.  Chest:  Breasts:    Right: No mass, nipple discharge, skin change or tenderness.     Left: No mass, nipple discharge, skin change or tenderness.  Abdominal:     General: Bowel sounds are normal.     Palpations: Abdomen is soft. There is no hepatomegaly, splenomegaly or mass.     Tenderness: There is no abdominal tenderness.     Hernia: No hernia is present.  Genitourinary:    Comments: Normal external female genitalia No uterine or adnexal mass or tenderness.  Musculoskeletal:     Cervical back: Normal range of motion and neck supple.     Right lower leg: No edema.     Left lower leg: No edema.  Feet:     Right foot:      Skin integrity: Skin integrity normal.     Toenail Condition: Right toenails are normal.     Left foot:     Skin integrity: Skin integrity normal.     Toenail Condition: Left toenails are normal.  Lymphadenopathy:     Head:  Right side of head: No submental or submandibular adenopathy.     Left side of head: No submental or submandibular adenopathy.     Cervical: No cervical adenopathy.     Upper Body:     Right upper body: No supraclavicular or axillary adenopathy.     Left upper body: No supraclavicular or axillary adenopathy.     Lower Body: No right inguinal adenopathy. No left inguinal adenopathy.  Skin:    General: Skin is warm.     Capillary Refill: Capillary refill takes less than 2 seconds.     Findings: No rash.  Neurological:     Mental Status: She is alert.     Cranial Nerves: Cranial nerves are intact.     Sensory: Sensation is intact.     Motor: Motor function is intact.     Coordination: Coordination is intact.     Gait: Gait is intact.     Deep Tendon Reflexes: Reflexes are normal and symmetric.  Psychiatric:        Attention and Perception: Attention normal.        Mood and Affect: Mood and affect normal.        Speech: Speech normal.        Behavior: Behavior normal. Behavior is cooperative.   Exam, including pelvic normal  Assessment & Plan   CPE without pap FIT testing to return in 1 week Shingrix and [employer] booster. Repeat Shingrix in 2-6 months. Influenza vaccine on Monday CBC, CMP, HIV, Hep C  2.  Food Insecurity:  referral to Leeann Must with [employer] for weekly food deliveries/cooking and art classes.    3.  Hypercholesterolemia:  FLP  4.  Decreased visual acuity:  would like referral to [facility] for new glasses.

## 2021-07-28 LAB — COMPREHENSIVE METABOLIC PANEL
ALT: 11 IU/L (ref 0–32)
AST: 15 IU/L (ref 0–40)
Albumin/Globulin Ratio: 2.1 (ref 1.2–2.2)
Albumin: 4.9 g/dL (ref 3.8–4.9)
Alkaline Phosphatase: 52 IU/L (ref 44–121)
BUN/Creatinine Ratio: 18 (ref 9–23)
BUN: 13 mg/dL (ref 6–24)
Bilirubin Total: 0.2 mg/dL (ref 0.0–1.2)
CO2: 26 mmol/L (ref 20–29)
Calcium: 9.5 mg/dL (ref 8.7–10.2)
Chloride: 101 mmol/L (ref 96–106)
Creatinine, Ser: 0.74 mg/dL (ref 0.57–1.00)
Globulin, Total: 2.3 g/dL (ref 1.5–4.5)
Glucose: 84 mg/dL (ref 70–99)
Potassium: 4.1 mmol/L (ref 3.5–5.2)
Sodium: 141 mmol/L (ref 134–144)
Total Protein: 7.2 g/dL (ref 6.0–8.5)
eGFR: 96 mL/min/{1.73_m2} (ref >59)

## 2021-07-28 LAB — CBC WITH DIFFERENTIAL/PLATELET
Basophils Absolute: 0 10*3/uL (ref 0.0–0.2)
Basos: 1 %
EOS (ABSOLUTE): 0.1 10*3/uL (ref 0.0–0.4)
Eos: 1 %
Hematocrit: 34.9 % (ref 34.0–46.6)
Hemoglobin: 11.5 g/dL (ref 11.1–15.9)
Immature Grans (Abs): 0 10*3/uL (ref 0.0–0.1)
Immature Granulocytes: 0 %
Lymphocytes Absolute: 2.7 10*3/uL (ref 0.7–3.1)
Lymphs: 50 %
MCH: 27.8 pg (ref 26.6–33.0)
MCHC: 33 g/dL (ref 31.5–35.7)
MCV: 84 fL (ref 79–97)
Monocytes Absolute: 0.5 10*3/uL (ref 0.1–0.9)
Monocytes: 9 %
Neutrophils Absolute: 2.1 10*3/uL (ref 1.4–7.0)
Neutrophils: 39 %
Platelets: 212 10*3/uL (ref 150–450)
RBC: 4.14 x10E6/uL (ref 3.77–5.28)
RDW: 12.5 % (ref 11.7–15.4)
WBC: 5.5 10*3/uL (ref 3.4–10.8)

## 2021-07-28 LAB — HIV ANTIBODY (ROUTINE TESTING W REFLEX): HIV Screen 4th Generation wRfx: NONREACTIVE

## 2021-07-28 LAB — LIPID PANEL W/O CHOL/HDL RATIO
Cholesterol, Total: 229 mg/dL — ABNORMAL HIGH (ref 100–199)
HDL: 84 mg/dL (ref >39)
LDL Chol Calc (NIH): 137 mg/dL — ABNORMAL HIGH (ref 0–99)
Triglycerides: 49 mg/dL (ref 0–149)
VLDL Cholesterol Cal: 8 mg/dL (ref 5–40)

## 2021-07-28 LAB — HEPATITIS C ANTIBODY: Hep C Virus Ab: <0.1 s/co ratio (ref 0.0–0.9)

## 2021-08-03 ENCOUNTER — Other Ambulatory Visit: Payer: Self-pay

## 2021-08-03 DIAGNOSIS — Z1211 Encounter for screening for malignant neoplasm of colon: Secondary | ICD-10-CM

## 2021-08-03 LAB — POC FIT TEST STOOL: Fecal Occult Blood: POSITIVE

## 2021-08-06 ENCOUNTER — Other Ambulatory Visit: Payer: Self-pay | Admitting: Obstetrics and Gynecology

## 2021-08-06 ENCOUNTER — Encounter: Payer: Self-pay | Admitting: Internal Medicine

## 2021-08-06 DIAGNOSIS — Z1231 Encounter for screening mammogram for malignant neoplasm of breast: Secondary | ICD-10-CM

## 2021-09-23 ENCOUNTER — Other Ambulatory Visit: Payer: Self-pay | Admitting: Internal Medicine

## 2021-09-23 DIAGNOSIS — R195 Other fecal abnormalities: Secondary | ICD-10-CM

## 2021-09-25 ENCOUNTER — Other Ambulatory Visit: Payer: Self-pay

## 2021-09-25 ENCOUNTER — Ambulatory Visit: Payer: Self-pay | Admitting: *Deleted

## 2021-09-25 ENCOUNTER — Ambulatory Visit
Admission: RE | Admit: 2021-09-25 | Discharge: 2021-09-25 | Disposition: A | Discharge status: Home / Self Care | Payer: No Typology Code available for payment source | Source: Ambulatory Visit | Attending: Obstetrics and Gynecology | Admitting: Obstetrics and Gynecology

## 2021-09-25 VITALS — BP 118/72 | Wt 129.2 lb

## 2021-09-25 DIAGNOSIS — Z1231 Encounter for screening mammogram for malignant neoplasm of breast: Secondary | ICD-10-CM

## 2021-09-25 DIAGNOSIS — Z1239 Encounter for other screening for malignant neoplasm of breast: Secondary | ICD-10-CM

## 2021-09-25 NOTE — Progress Notes (Signed)
Madison Adkins is a 55 y.o. female who presents to [facility] clinic today with no complaints.    Pap Smear: Pap smear not completed today. Last Pap smear was 11/20/2020 at the free cervical cancer screening clinic and was normal with negative HPV. Per patient has a history of an abnormal Pap smear in 2001 that thinks a colposcopy was completed for follow-up. Per patient has had at least three normal Pap smears since abnormal. Last Pap smear result is available in Epic.    Physical exam: Breasts Breasts symmetrical. No skin abnormalities bilateral breasts. No nipple retraction bilateral breasts. No nipple discharge bilateral breasts. No lymphadenopathy. No lumps palpated bilateral breasts. No complaints of pain or tenderness on exam.  MM Breast Surgical Specimen  Result Date: 01/10/2021 CLINICAL DATA:  Status post excisional biopsy of the left breast. EXAM: SPECIMEN RADIOGRAPH OF THE LEFT BREAST COMPARISON:  Previous exam(s). FINDINGS: Status post excision of the left breast. The radioactive seed and biopsy marker clip are present, completely intact, and were marked for pathology. IMPRESSION: Specimen radiograph of the left breast. Electronically Signed   By: Lillia Mountain M.D.   On: 01/10/2021 10:47   MS DIGITAL SCREENING TOMO BILATERAL  Result Date: 09/24/2020 CLINICAL DATA:  Screening. EXAM: DIGITAL SCREENING BILATERAL MAMMOGRAM WITH TOMO AND CAD COMPARISON:  Previous exam(s). ACR Breast Density Category c: The breast tissue is heterogeneously dense, which may obscure small masses. FINDINGS: In the left breast, a possible asymmetry warrants further evaluation. In the right breast, no findings suspicious for malignancy. Images were processed with CAD. IMPRESSION: Further evaluation is suggested for possible asymmetry in the left breast. RECOMMENDATION: Diagnostic mammogram and possibly ultrasound of the left breast. (Code:FI-L-15M) The patient will be contacted regarding the findings, and additional imaging  will be scheduled. BI-RADS CATEGORY  0: Incomplete. Need additional imaging evaluation and/or prior mammograms for comparison. Electronically Signed   By: Dorise Bullion III M.D   On: 09/24/2020 07:14   MS DIGITAL SCREENING TOMO BILATERAL  Result Date: 04/06/2019 CLINICAL DATA:  Screening. EXAM: DIGITAL SCREENING BILATERAL MAMMOGRAM WITH TOMO AND CAD COMPARISON:  Previous exam(s). ACR Breast Density Category c: The breast tissue is heterogeneously dense, which may obscure small masses. FINDINGS: There are no findings suspicious for malignancy. Images were processed with CAD. IMPRESSION: No mammographic evidence of malignancy. A result letter of this screening mammogram will be mailed directly to the patient. RECOMMENDATION: Screening mammogram in one year. (Code:SM-B-01Y) BI-RADS CATEGORY  1: Negative. Electronically Signed   By: Dorise Bullion III M.D   On: 04/06/2019 16:00   MS DIGITAL SCREENING TOMO BILATERAL  Result Date: 01/15/2018 CLINICAL DATA:  Screening. EXAM: DIGITAL SCREENING BILATERAL MAMMOGRAM WITH TOMO AND CAD COMPARISON:  Previous exam(s). ACR Breast Density Category b: There are scattered areas of fibroglandular density. FINDINGS: There are no findings suspicious for malignancy. Images were processed with CAD. IMPRESSION: No mammographic evidence of malignancy. A result letter of this screening mammogram will be mailed directly to the patient. RECOMMENDATION: Screening mammogram in one year. (Code:SM-B-01Y) BI-RADS CATEGORY  1: Negative. Electronically Signed   By: Curlene Dolphin M.D.   On: 01/15/2018 15:20   MM LT RADIOACTIVE SEED LOC MAMMO GUIDE  Result Date: 01/08/2021 CLINICAL DATA:  55 year old female for radioactive seed localization of LEFT breast stromal fibrosis, prior to excision EXAM: MAMMOGRAPHIC GUIDED RADIOACTIVE SEED LOCALIZATION OF THE LEFT BREAST COMPARISON:  Previous exam(s). FINDINGS: Patient presents for radioactive seed localization prior to LEFT breast excision. I  met with the patient  and we discussed the procedure of seed localization including benefits and alternatives. We discussed the high likelihood of a successful procedure. We discussed the risks of the procedure including infection, bleeding, tissue injury and further surgery. We discussed the low dose of radioactivity involved in the procedure. Informed, written consent was given. The usual time-out protocol was performed immediately prior to the procedure. Using mammographic guidance, sterile technique, 1% lidocaine and an I-125 radioactive seed, the RIBBON clip was localized using a LATERAL approach. The follow-up mammogram images confirm the seed in the expected location and were marked for Dr. Barry Dienes. Follow-up survey of the patient confirms presence of the radioactive seed. Order number of I-125 seed:  [id number]. Total activity:  4.098 millicuries.  Reference Date: 12/28/2020. The patient tolerated the procedure well and was [facility]. She was given instructions regarding seed removal. IMPRESSION: Radioactive seed localization LEFT breast. No apparent complications. Electronically Signed   By: Margarette Canada M.D.   On: 01/08/2021 13:29   MS DIGITAL DIAG TOMO UNI LEFT  Result Date: 09/30/2020 CLINICAL DATA:  Screening recall for possible left breast asymmetry. EXAM: DIGITAL DIAGNOSTIC UNILATERAL LEFT MAMMOGRAM WITH TOMO AND CAD; ULTRASOUND LEFT BREAST LIMITED COMPARISON:  Previous exams. ACR Breast Density Category c: The breast tissue is heterogeneously dense, which may obscure small masses. FINDINGS: Additional tomograms were performed of the left breast. There is a persistent somewhat tubular appearing asymmetry in the lower central to slightly inner left breast possibly related to a duct. Mammographic images were processed with CAD. Targeted ultrasound of the central left breast was performed. There is a focally dilated duct containing mass versus debris in the retroareolar left breast  measuring 0.9 x 0.5 x 0.8 cm. This may correspond with the asymmetry seen in the left breast at mammography. Additional mildly dilated fluid-filled ducts are identified in the retroareolar left breast. No lymphadenopathy seen in the left axilla. IMPRESSION: Indeterminate 0.9 cm mass in the retroareolar left breast which may represent a focally dilated duct containing mass versus debris. RECOMMENDATION: Recommend ultrasound-guided biopsy of the mass in the retroareolar left breast. This will be scheduled for the patient. I have discussed the findings and recommendations with the patient. If applicable, a reminder letter will be sent to the patient regarding the next appointment. BI-RADS CATEGORY  4: Suspicious. Electronically Signed   By: Everlean Alstrom M.D.   On: 09/30/2020 07:58   MM CLIP PLACEMENT LEFT  Result Date: 10/17/2020 CLINICAL DATA:  55 year old female status post ultrasound-guided biopsy of the left breast. EXAM: DIAGNOSTIC LEFT MAMMOGRAM POST ULTRASOUND BIOPSY COMPARISON:  Previous exam(s). FINDINGS: Mammographic images were obtained following ultrasound guided biopsy of the left breast. The biopsy marking clip is in the central subareolar left breast. This does not correspond with the location of the previously identified asymmetry, which is slightly medial retroareolar. However, the asymmetry is no longer well seen on today's views and may have decompressed with the biopsy. IMPRESSION: Ribbon shaped clip in the subareolar left breast. This does not correspond with the location of the mammographically identified asymmetry, though the asymmetry is no longer seen on today's clip films. Final Assessment: Post Procedure Mammograms for Marker Placement Electronically Signed   By: Kristopher Oppenheim M.D.   On: 10/17/2020 14:16         Pelvic/Bimanual Pap is not indicated today per BCCCP guidelines.   Smoking History: Patient has never smoked.   Patient Navigation: Patient education provided.  Access to services provided for patient through [facility]  program. Transportation provided to and from [facility] and mammogram appointment. Patient has food insecurities. Patient escorted to the food market at the [facility] for groceries.  Colorectal Cancer Screening: Per patient has never had a colonoscopy completed. FIT Test completed 08/03/2021 that was positive. No complaints today.     Breast and Cervical Cancer Risk Assessment: Patient has a family hisory of her sister having breast cancer. Patient has no known genetic mutations or history of radiation treatment to the chest before age 68. Per patient has a history of cervical dysplasia. Patient has no history of being immunocompromised or DES exposure in-utero.    Risk Assessment     Risk Scores       09/25/2021 09/19/2020   Last edited by: Demetrius Revel, LPN McGill, Sherie Mamie Nick, LPN   5-year risk: 3.4 % 2.1 %   Lifetime risk: 19.2 % 12.8 %            A: BCCCP exam without pap smear No complaints.  P: Referred patient to the [facility] for a screening mammogram on mobile unit. Appointment scheduled Tuesday, September 25, 2021 at 1530.  Loletta Parish, RN 09/25/2021 3:02 PM

## 2021-09-25 NOTE — Patient Instructions (Signed)
Explained breast self awareness with [name]. Patient did not need a Pap smear today due to last Pap smear and HPV typing was 11/20/2020. Let her know BCCCP will cover Pap smears and HPV typing every 5 years unless has a history of abnormal Pap smears. Referred patient to the [facility] for a screening mammogram on mobile unit. Appointment scheduled Tuesday, September 25, 2021 at 1530. Patient escorted to the mobile unit following BCCCP appointment for her screening mammogram. Let patient know the [facility] will follow up with her within the next couple weeks with results of her mammogram by letter or phone. Madison Adkins verbalized understanding.  Arrow Emmerich, Arvil Chaco, RN 3:02 PM

## 2021-09-28 ENCOUNTER — Ambulatory Visit (INDEPENDENT_AMBULATORY_CARE_PROVIDER_SITE_OTHER): Payer: Self-pay | Admitting: Internal Medicine

## 2021-09-28 ENCOUNTER — Other Ambulatory Visit: Payer: Self-pay

## 2021-09-28 DIAGNOSIS — Z23 Encounter for immunization: Secondary | ICD-10-CM

## 2022-03-13 ENCOUNTER — Encounter (HOSPITAL_COMMUNITY): Payer: Self-pay

## 2022-06-26 ENCOUNTER — Ambulatory Visit (INDEPENDENT_AMBULATORY_CARE_PROVIDER_SITE_OTHER): Payer: Self-pay | Admitting: Internal Medicine

## 2022-06-26 DIAGNOSIS — Z23 Encounter for immunization: Secondary | ICD-10-CM

## 2022-07-29 ENCOUNTER — Ambulatory Visit: Payer: Self-pay | Admitting: Internal Medicine

## 2022-07-29 ENCOUNTER — Encounter: Payer: Self-pay | Admitting: Internal Medicine

## 2022-07-29 VITALS — BP 116/80 | HR 72 | Ht 62.5 in | Wt 129.0 lb

## 2022-07-29 DIAGNOSIS — Z0000 Encounter for general adult medical examination without abnormal findings: Secondary | ICD-10-CM

## 2022-07-29 DIAGNOSIS — E7800 Pure hypercholesterolemia, unspecified: Secondary | ICD-10-CM

## 2022-07-29 DIAGNOSIS — Z23 Encounter for immunization: Secondary | ICD-10-CM

## 2022-07-29 DIAGNOSIS — Z780 Asymptomatic menopausal state: Secondary | ICD-10-CM

## 2022-07-29 DIAGNOSIS — Z1231 Encounter for screening mammogram for malignant neoplasm of breast: Secondary | ICD-10-CM

## 2022-07-29 DIAGNOSIS — R195 Other fecal abnormalities: Secondary | ICD-10-CM

## 2022-07-29 NOTE — Progress Notes (Signed)
Subjective:    Patient ID: Madison Adkins, female   DOB: Feb 05, 1966, 56 y.o.   MRN: [id number]   HPI  CPE without pap  1.  Pap:  Last pap normal on 1/24.2022.    2.  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  Normal mammogram subsequently 09-17-21.    3.  Osteoprevention:  Never has had DEXA.  Takes Calcium and Vitamin D.  Walks on her on 3[place]4 miles [place]    4.  [place] Cards/FIT:  + last October.  Not clear what happened with referral.  States she never heard from specialist.    5.  Colonoscopy:  Never.  No family history of colon cancer.    6.  Immunizations:  Last Hep B completed her vaccination course.  She received second MMR for immigration purposes at [place].     7.  Glucose/Cholesterol:  Glucose fine in past.  Total cholesterol mildly high in past with very high HDL.   Lipid Panel     Component Value Date/Time   CHOL 229 (H) 07/27/2021 1003   TRIG 49 07/27/2021 1003   HDL 84 07/27/2021 1003   LDLCALC 137 (H) 07/27/2021 1003   LABVLDL 8 07/27/2021 1003     Current Meds  Medication Sig   Calcium Citrate-Vitamin D 500-10 MG-MCG CHEW Chew by mouth.   [DISCONTINUED] Calcium Citrate-Vitamin D 250-200 MG-UNIT TABS 2 tabs by mouth twice daily.   No Known Allergies  Past Medical History:  Diagnosis Date   Abnormal mammogram 09/19/2020   Benign findings ultimately on left breast excisional biopsy 12/2020   Abnormal Pap smear of cervix 2003   [facility]   Fibrocystic breast disease, left 01/10/2021   Hyperlipidemia 2019   Very high HDL however.   Past Surgical History:  Procedure Laterality Date   RADIOACTIVE SEED GUIDED EXCISIONAL BREAST BIOPSY Left 01/10/2021   Procedure: LEFT BREAST SEED LOCALIZED EXCISIONAL BIOPSY;  Surgeon: Stark Klein, MD;  Location: [facility];  Service: General;  Laterality: Left;   Family History  Problem Relation Age of Onset    Cancer Sister        Breast   Breast cancer Sister    Allergies Daughter    Allergies Daughter    Cancer Daughter        Ovarian cancer--died September 18, 2019.   Social History   Socioeconomic History   Marital status: Divorced    Spouse name: Not on file   Number of children: 3   Years of education: 12+   Highest education level: Some college, no degree  Occupational History   Occupation: Development worker, community  Tobacco Use   Smoking status: Never    Passive exposure: Never   Smokeless tobacco: Never  Vaping Use   Vaping Use: Never used  Substance and Sexual Activity   Alcohol use: Not Currently    Comment: Rare previously, but stopped altogether end of 2021.   Drug use: No   Sexual activity: Not Currently    Birth control/protection: Post-menopausal  Other Topics Concern   Not on file  Social History Narrative   Originally from [place]   Came to [place]. In 2001   Lives with her 2 daughters, though one is obtaining her masters at [employer].  Other daughter with major in [place] and Gender studies who is looking for a job.   Father not involved.   Goes to [employer]  in downtown   Would like to go to college.  She is waiting until her daughters are 44 yo.    Social Determinants of Health   Financial Resource Strain: Low Risk  (07/29/2022)   Overall Financial Resource Strain (CARDIA)    Difficulty of Paying Living Expenses: Not very hard  Food Insecurity: Food Insecurity Present (09/25/2021)   Hunger Vital Sign    Worried About Running Out of Food in the Last Year: Often true    Ran Out of Food in the Last Year: Often true  Transportation Needs: No Transportation Needs (07/29/2022)   PRAPARE - Hydrologist (Medical): No    Lack of Transportation (Non-Medical): No  Physical Activity: Insufficiently Active (11/21/2017)   Exercise Vital Sign    Days of Exercise per Week: 7 days    Minutes of Exercise per Session: 20 min  Stress: Stress Concern  Present (11/21/2017)   [place]    Feeling of Stress : To some extent  Social Connections: Moderately Integrated (11/21/2017)   Social Connection and Isolation Panel [NHANES]    Frequency of Communication with Friends and Family: Once a week    Frequency of Social Gatherings with Friends and Family: More than three times a week    Attends Religious Services: More than 4 times per year    Active Member of [employer] or Organizations: Yes    Attends Music therapist: More than 4 times per year    Marital Status: Divorced  Intimate Partner Violence: Not At Risk (07/27/2021)   Humiliation, Afraid, Rape, and Kick questionnaire    Fear of Current or Ex-Partner: No    Emotionally Abused: No    Physically Abused: No    Sexually Abused: No      Review of Systems  HENT:  Negative for dental problem (Has had appt with dental clinic--had deep cleaning and xrays.  No other concerns) and hearing loss.   Eyes:  Positive for visual disturbance (wears glasses and corrective lenses working well for her.).  Respiratory:  Negative for shortness of breath.   Cardiovascular:  Negative for chest pain, palpitations and leg swelling.  Gastrointestinal:  Negative for abdominal pain, blood in stool (No melena.  Again, history of + FIT last October for which she was referred to GI, but states never heard from them.), constipation and diarrhea.  Genitourinary:  Negative for vaginal discharge.       Had not had a period for 8 years. Sept 28th, awakened with spot of blood in her underwear and when wiped following urination, had very slight blood on tissue.   She had no discomfort/cramping/burning on urination.   No further blood in underwear or on tissue the rest of the day or since.    Neurological:  Negative for weakness and numbness.  Psychiatric/Behavioral:  Negative for dysphoric mood. The patient is not nervous/anxious.        Objective:   BP 116/80 (BP Location: Right Arm, Patient Position: Sitting, Cuff Size: Normal)   Pulse 72   Ht 5' 2.5" (1.588 m)   Wt 129 lb (58.5 kg)   LMP 09/11/2011   BMI 23.22 kg/m   Physical Exam Constitutional:      Appearance: She is normal weight.  HENT:     Head: Normocephalic and atraumatic.     Right Ear: Tympanic membrane, ear canal and external ear normal.     Left Ear: Tympanic membrane,  ear canal and external ear normal.     Nose: Nose normal.     Mouth/Throat:     Mouth: Mucous membranes are dry.     Pharynx: Oropharynx is clear.     Comments: Teeth appear healthy Eyes:     Extraocular Movements: Extraocular movements intact.     Conjunctiva/sclera: Conjunctivae normal.     Pupils: Pupils are equal, round, and reactive to light.     Comments: Discs sharp  Neck:     Thyroid: No thyroid mass or thyromegaly.  Cardiovascular:     Rate and Rhythm: Normal rate and regular rhythm.     Heart sounds: S1 normal and S2 normal. No murmur heard.    No friction rub. No S3 or S4 sounds.     Comments: No carotid bruits.  Carotid, radial, femoral, DP and PT pulses normal and equal.    Pulmonary:     Effort: Pulmonary effort is normal.     Breath sounds: Normal breath sounds and air entry.  Chest:  Breasts:    Right: No inverted nipple, mass, nipple discharge or skin change.     Left: No inverted nipple, mass, nipple discharge or skin change.  Abdominal:     General: Abdomen is flat. Bowel sounds are normal.     Palpations: Abdomen is soft. There is no hepatomegaly, splenomegaly or mass.     Tenderness: There is no abdominal tenderness.     Hernia: No hernia is present.  Genitourinary:    Comments: Normal female external genitalia Vaginal and cervical mucosa with atrophy.  On posterior vaginal wall, she has a linear area of scant submucosal bleeding No uterine or adnexal mass or tenderness.  Musculoskeletal:        General: Normal range of motion.      Cervical back: Normal range of motion and neck supple.     Right lower leg: No edema.     Left lower leg: No edema.  Feet:     Comments: Bunion of right foot.  No erythema or tenderness Lymphadenopathy:     Head:     Right side of head: No submental or submandibular adenopathy.     Left side of head: No submental or submandibular adenopathy.     Cervical: No cervical adenopathy.     Upper Body:     Right upper body: No supraclavicular or axillary adenopathy.     Left upper body: No supraclavicular or axillary adenopathy.     Lower Body: No right inguinal adenopathy. No left inguinal adenopathy.  Skin:    General: Skin is warm.     Capillary Refill: Capillary refill takes less than 2 seconds.     Findings: No rash.  Neurological:     General: No focal deficit present.     Mental Status: She is alert and oriented to person, place, and time.     Cranial Nerves: Cranial nerves 2-12 are intact.     Sensory: Sensation is intact.     Motor: Motor function is intact.     Coordination: Coordination is intact.     Gait: Gait is intact.     Deep Tendon Reflexes: Reflexes are normal and symmetric.  Psychiatric:        Speech: Speech normal.        Behavior: Behavior normal. Behavior is cooperative.      Assessment & Plan    CPE without pap. Mammogram ordered with [facility] for November FIT repeat to return in 2  weeks. Vitamin D level, CBC, CMP, FLP Influenza vaccine Encouraged vaccination with new COVID either here or where she sees it available for free.  2.  Hx of + FIT with normal CBC last year:  was sent through [facility] and not clear she was ever set up with appt.  Refer to [facility] and will need to apply for financial assistance with [facility].  3.  Vaginal spotting:  gives history at end of visit that she had cleaned inside vaginal canal with shower just before this occurred and with findings on exam today, that tissue is somewhat fragile--suspect she had bleeding just from insertion of  small pederson speculum today.  To call if recurs.    4.  Hypercholesterolemia:  FLP>

## 2022-07-30 LAB — CBC WITH DIFFERENTIAL/PLATELET
Basophils Absolute: 0 10*3/uL (ref 0.0–0.2)
Basos: 1 %
EOS (ABSOLUTE): 0 10*3/uL (ref 0.0–0.4)
Eos: 1 %
Hematocrit: 36 % (ref 34.0–46.6)
Hemoglobin: 11.4 g/dL (ref 11.1–15.9)
Immature Grans (Abs): 0 10*3/uL (ref 0.0–0.1)
Immature Granulocytes: 0 %
Lymphocytes Absolute: 2.8 10*3/uL (ref 0.7–3.1)
Lymphs: 56 %
MCH: 27.5 pg (ref 26.6–33.0)
MCHC: 31.7 g/dL (ref 31.5–35.7)
MCV: 87 fL (ref 79–97)
Monocytes Absolute: 0.5 10*3/uL (ref 0.1–0.9)
Monocytes: 10 %
Neutrophils Absolute: 1.6 10*3/uL (ref 1.4–7.0)
Neutrophils: 32 %
Platelets: 178 10*3/uL (ref 150–450)
RBC: 4.14 x10E6/uL (ref 3.77–5.28)
RDW: 12.7 % (ref 11.7–15.4)
WBC: 4.9 10*3/uL (ref 3.4–10.8)

## 2022-07-30 LAB — COMPREHENSIVE METABOLIC PANEL
ALT: 7 IU/L (ref 0–32)
AST: 13 IU/L (ref 0–40)
Albumin/Globulin Ratio: 1.6 (ref 1.2–2.2)
Albumin: 4.4 g/dL (ref 3.8–4.9)
Alkaline Phosphatase: 47 IU/L (ref 44–121)
BUN/Creatinine Ratio: 21 (ref 9–23)
BUN: 17 mg/dL (ref 6–24)
Bilirubin Total: 0.2 mg/dL (ref 0.0–1.2)
CO2: 26 mmol/L (ref 20–29)
Calcium: 9.2 mg/dL (ref 8.7–10.2)
Chloride: 102 mmol/L (ref 96–106)
Creatinine, Ser: 0.81 mg/dL (ref 0.57–1.00)
Globulin, Total: 2.7 g/dL (ref 1.5–4.5)
Glucose: 81 mg/dL (ref 70–99)
Potassium: 4 mmol/L (ref 3.5–5.2)
Sodium: 141 mmol/L (ref 134–144)
Total Protein: 7.1 g/dL (ref 6.0–8.5)
eGFR: 86 mL/min/{1.73_m2} (ref >59)

## 2022-07-30 LAB — LIPID PANEL W/O CHOL/HDL RATIO
Cholesterol, Total: 225 mg/dL — ABNORMAL HIGH (ref 100–199)
HDL: 84 mg/dL (ref >39)
LDL Chol Calc (NIH): 135 mg/dL — ABNORMAL HIGH (ref 0–99)
Triglycerides: 35 mg/dL (ref 0–149)
VLDL Cholesterol Cal: 6 mg/dL (ref 5–40)

## 2022-07-30 LAB — VITAMIN D 25 HYDROXY (VIT D DEFICIENCY, FRACTURES): Vit D, 25-Hydroxy: 19.2 ng/mL — ABNORMAL LOW (ref 30.0–100.0)

## 2022-08-05 ENCOUNTER — Other Ambulatory Visit: Payer: Self-pay

## 2022-08-05 DIAGNOSIS — Z1211 Encounter for screening for malignant neoplasm of colon: Secondary | ICD-10-CM

## 2022-08-05 LAB — POC FIT TEST STOOL: Fecal Occult Blood: POSITIVE

## 2022-08-06 ENCOUNTER — Ambulatory Visit: Payer: Self-pay | Admitting: Internal Medicine

## 2022-08-06 DIAGNOSIS — Z23 Encounter for immunization: Secondary | ICD-10-CM

## 2022-08-13 MED ORDER — VITAMIN D3 25 MCG (1000 UT) PO CAPS: 1000.0000 [IU] | ORAL_CAPSULE | Freq: Every day | ORAL | Status: AC

## 2022-08-13 NOTE — Addendum Note (Signed)
Addended by: Marcelino Duster on: 08/13/2022 11:44 PM   Modules accepted: Orders

## 2022-08-14 ENCOUNTER — Encounter: Payer: Self-pay | Admitting: Gastroenterology

## 2022-08-28 ENCOUNTER — Other Ambulatory Visit: Payer: Self-pay | Admitting: *Deleted

## 2022-08-28 DIAGNOSIS — Z1231 Encounter for screening mammogram for malignant neoplasm of breast: Secondary | ICD-10-CM

## 2022-09-27 IMAGING — US US BREAST BX W LOC DEV 1ST LESION IMG BX SPEC US GUIDE*L*
1 series · 4 of 4 positions shown · non-contrast
Comparison: Previous exam(s).
COMPARISON: Previous exam(s).

Addendum:
CLINICAL DATA: 54-year-old female with an indeterminate subareolar
left breast mass.

EXAM:
ULTRASOUND GUIDED LEFT BREAST CORE NEEDLE BIOPSY

[Series 1: us breast bx w loc dev 1st lesion img bx spec us g · 0.05mm/px · 4 of 4 slices shown]
[im 1/4]
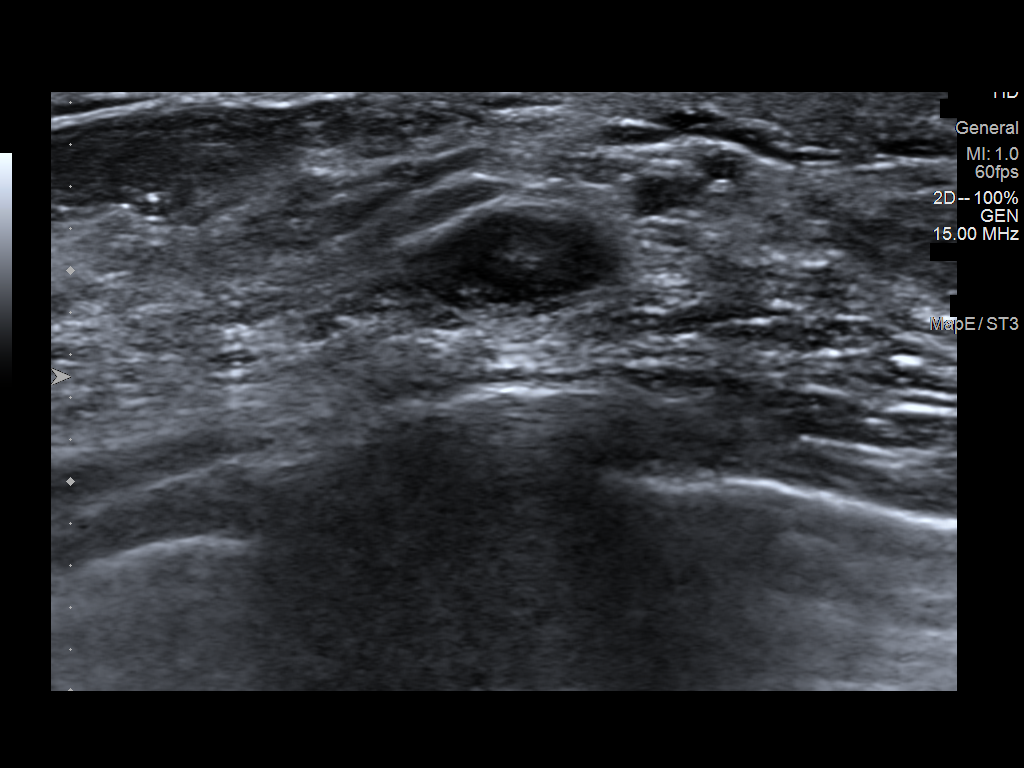
[im 2/4]
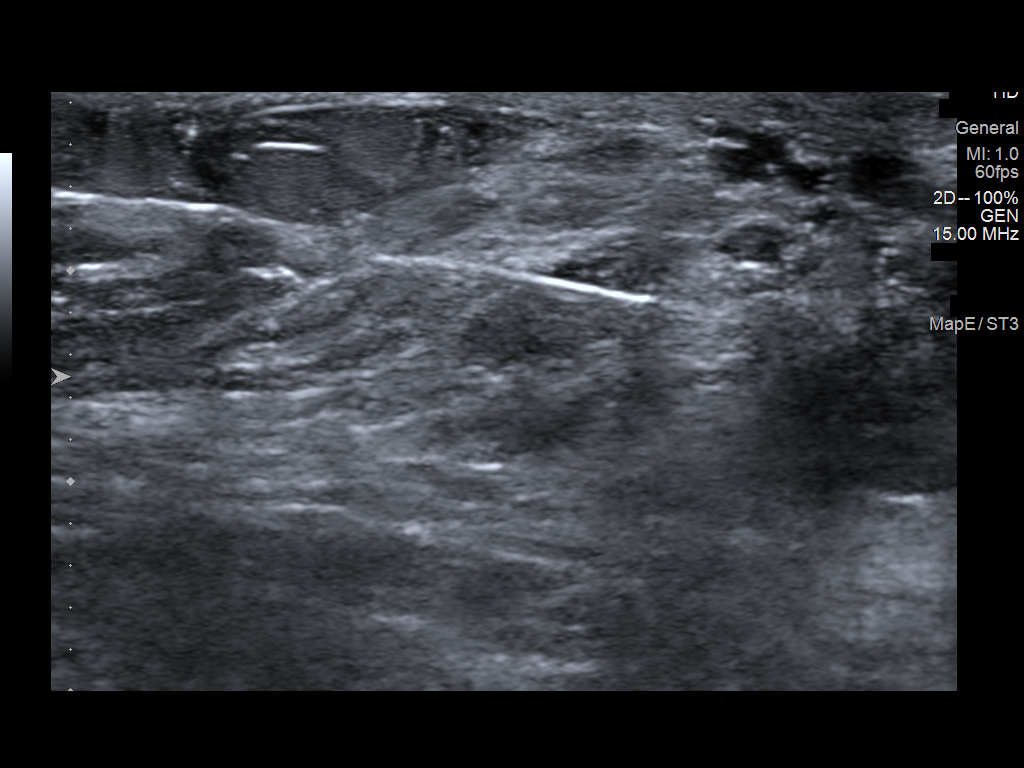
[im 3/4]
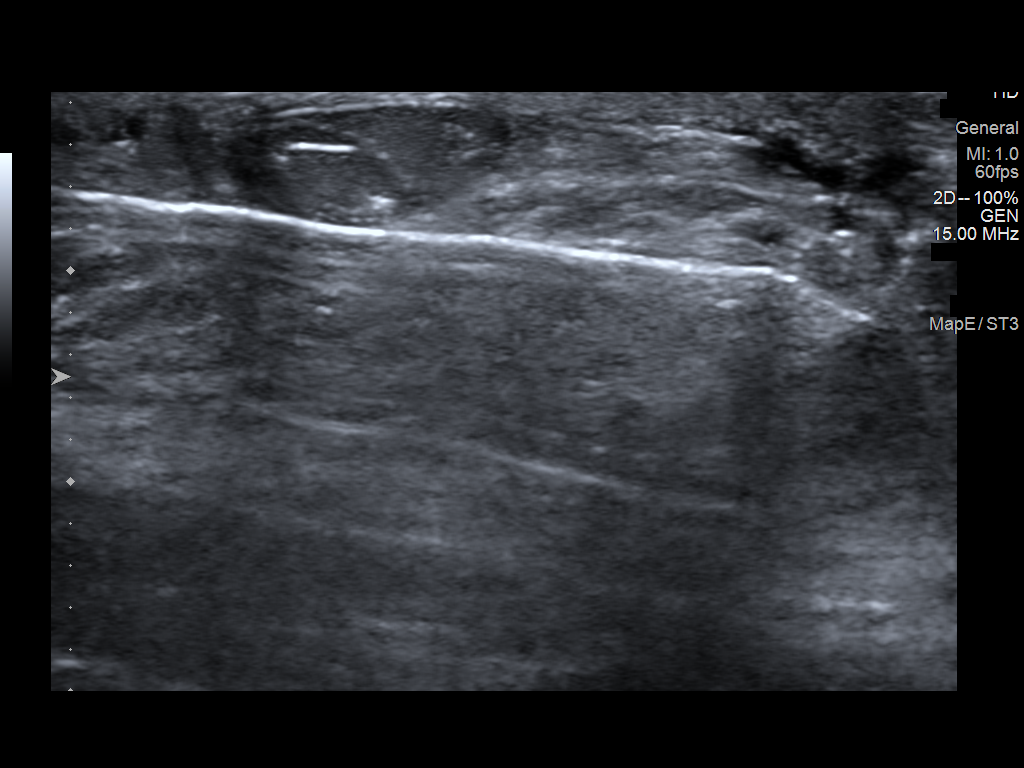
[im 4/4]
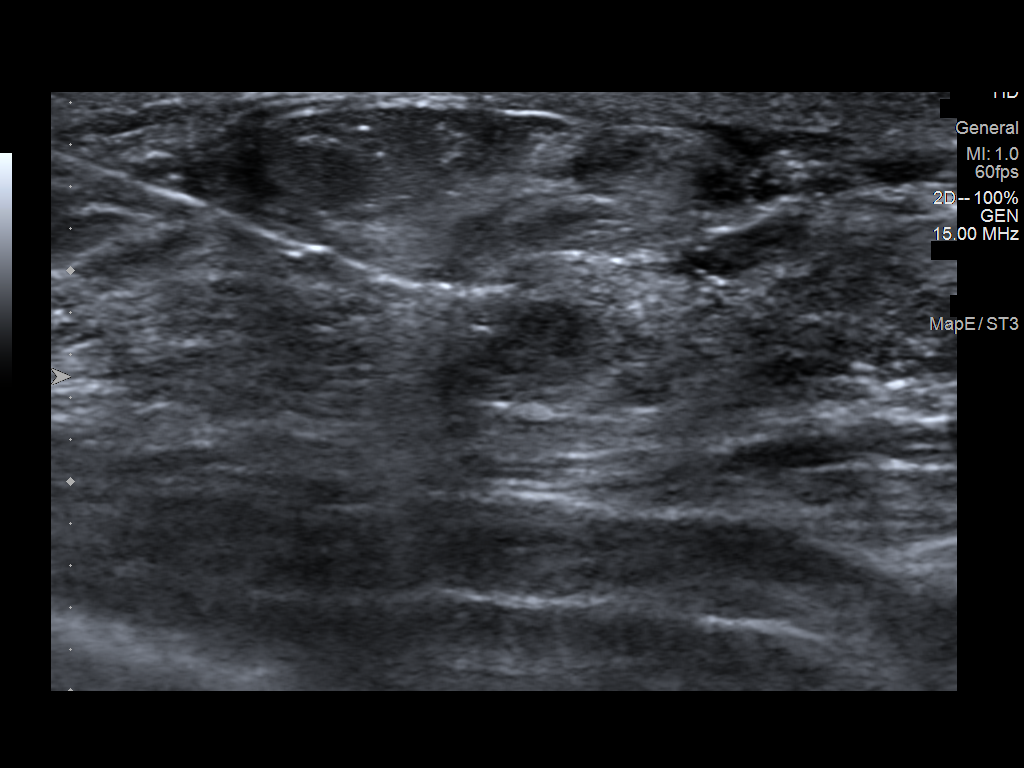

[4 of 4 positions shown; findings below may reference images not displayed]



Lesion quadrant: Lower inner quadrant

Using sterile technique and 1% Lidocaine as local anesthetic, under
direct ultrasound visualization, a 14 gauge Erxleben device was
used to perform biopsy of a mass in the retroareolar left breast
using a lateral approach. At the conclusion of the procedure a
ribbon shaped tissue marker clip was deployed into the biopsy
cavity. Follow up 2 view mammogram was performed and dictated
separately.
IMPRESSION: Ultrasound guided biopsy of the left breast. No apparent
complications.

ADDENDUM:
Pathology revealed EPITHELIAL LINED CYSTIC SPACE WITH STROMAL
FIBROSIS of the LEFT breast, retroareolar. Differential diagnosis
includes sclerotic intraductal papilloma and ruptured cyst. This was
found to be concordant by Dr. Sebut Naya, with excision
recommended.

Pathology results were discussed with the patient by telephone on
10/23/2020. The patient reported doing well after the biopsy with
tenderness at the site. Post biopsy instructions and care were
reviewed and questions were answered. The patient was encouraged to
call The [REDACTED] for any additional
concerns.

Surgical consultation has been rescheduled (per patient) with Dr.
Ger Hubbert at [REDACTED] on December 11, 2020.

Pathology results reported by Arul Alwan RN on 10/23/2020.



Lesion quadrant: Lower inner quadrant

Using sterile technique and 1% Lidocaine as local anesthetic, under
direct ultrasound visualization, a 14 gauge Erxleben device was
used to perform biopsy of a mass in the retroareolar left breast
using a lateral approach. At the conclusion of the procedure a
ribbon shaped tissue marker clip was deployed into the biopsy
cavity. Follow up 2 view mammogram was performed and dictated
separately.
IMPRESSION: Ultrasound guided biopsy of the left breast. No apparent
complications.

## 2022-09-27 IMAGING — MG MM BREAST LOCALIZATION CLIP
4 series · 4 of 12 positions shown · non-contrast
Comparison: Previous exam(s).

CLINICAL DATA: 54-year-old female status post ultrasound-guided
biopsy of the left breast.

EXAM:
DIAGNOSTIC LEFT MAMMOGRAM POST ULTRASOUND BIOPSY

[L ML synth-2D]
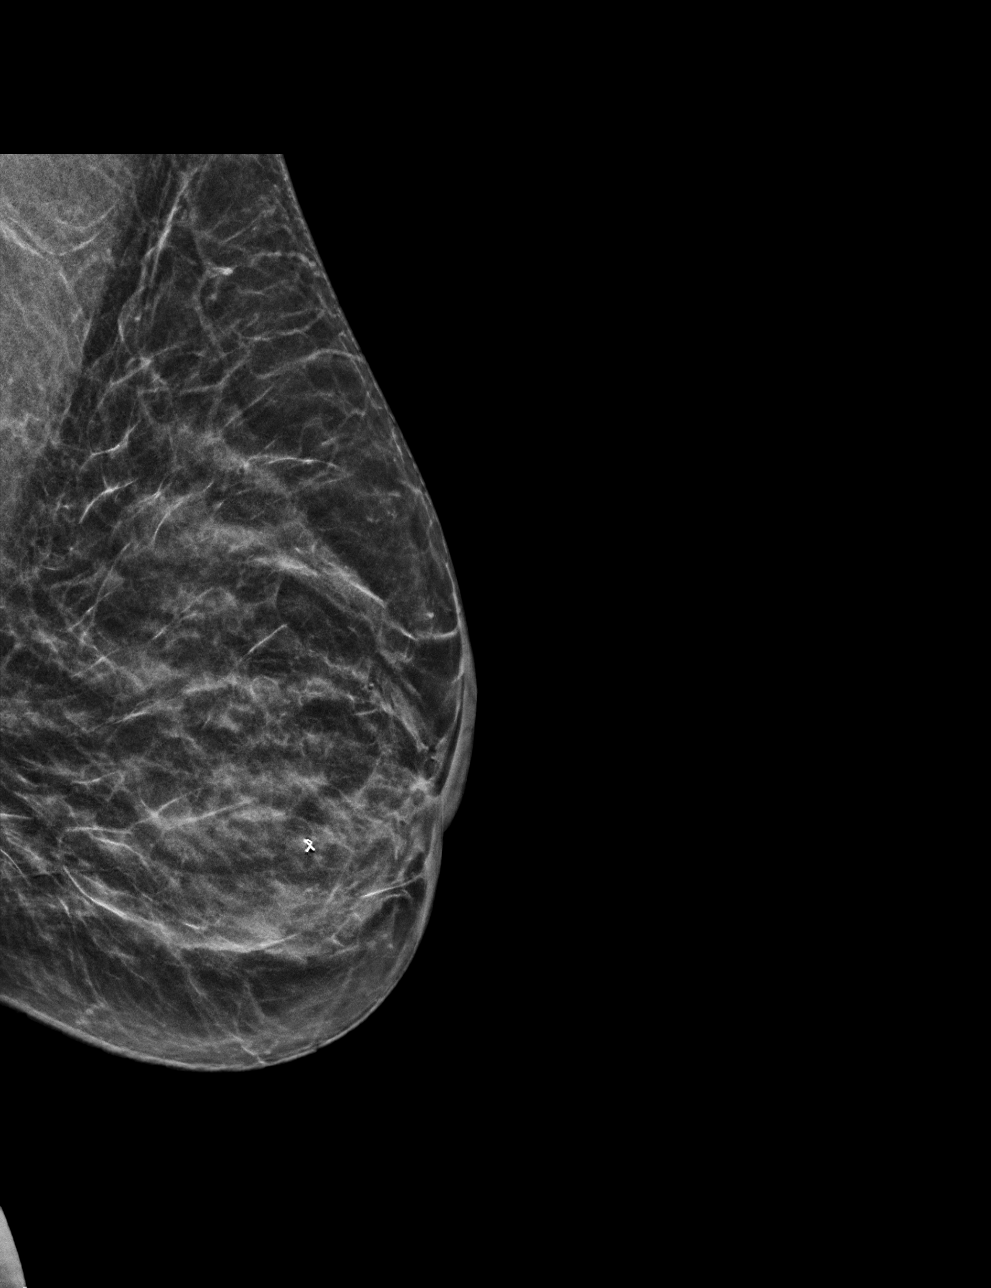

[L CC synth-2D]
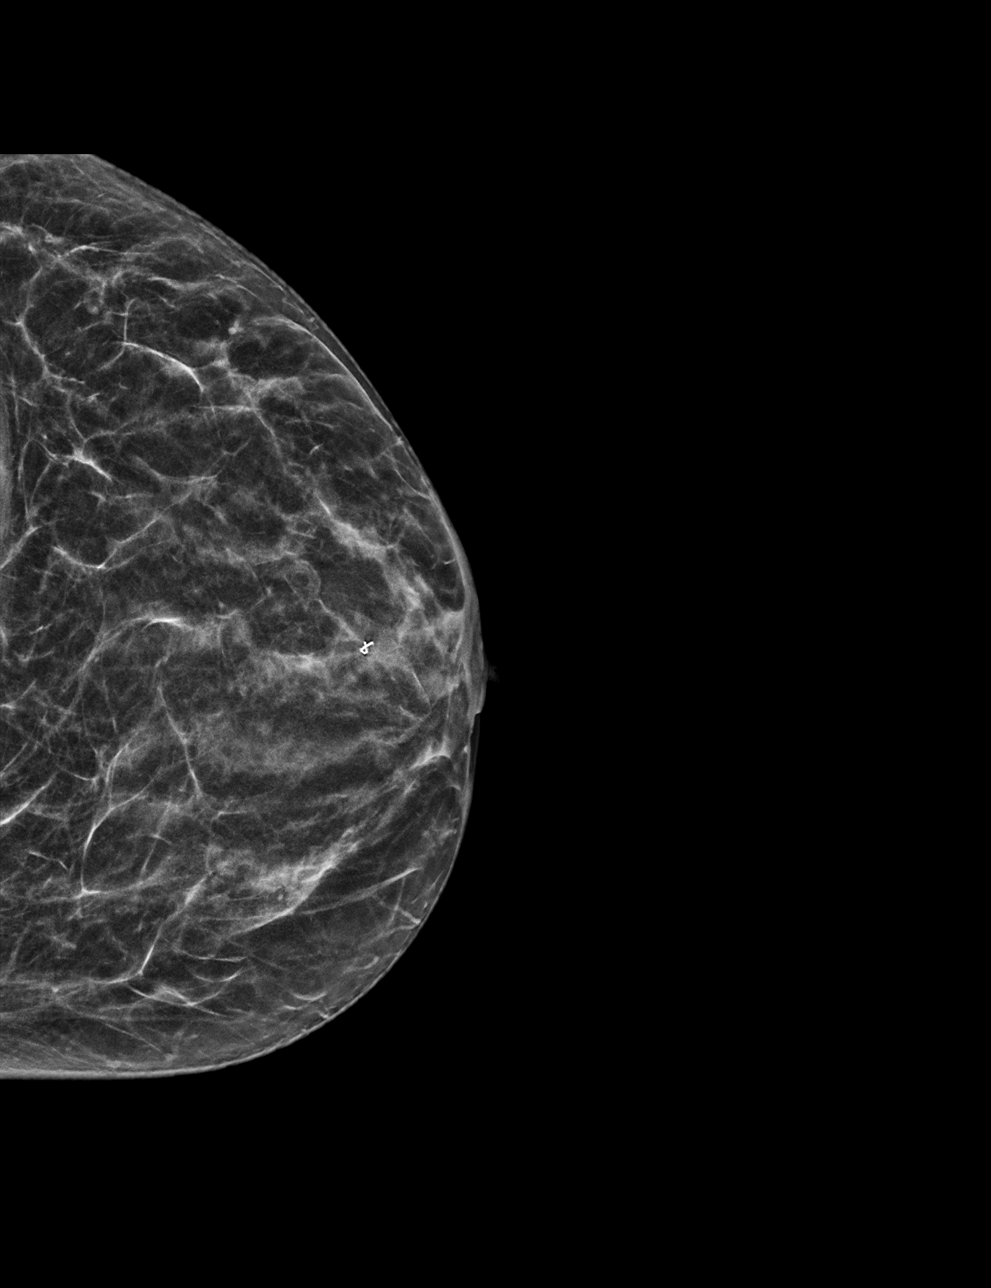

[L CC tomo · tomo slice 20/39.0]
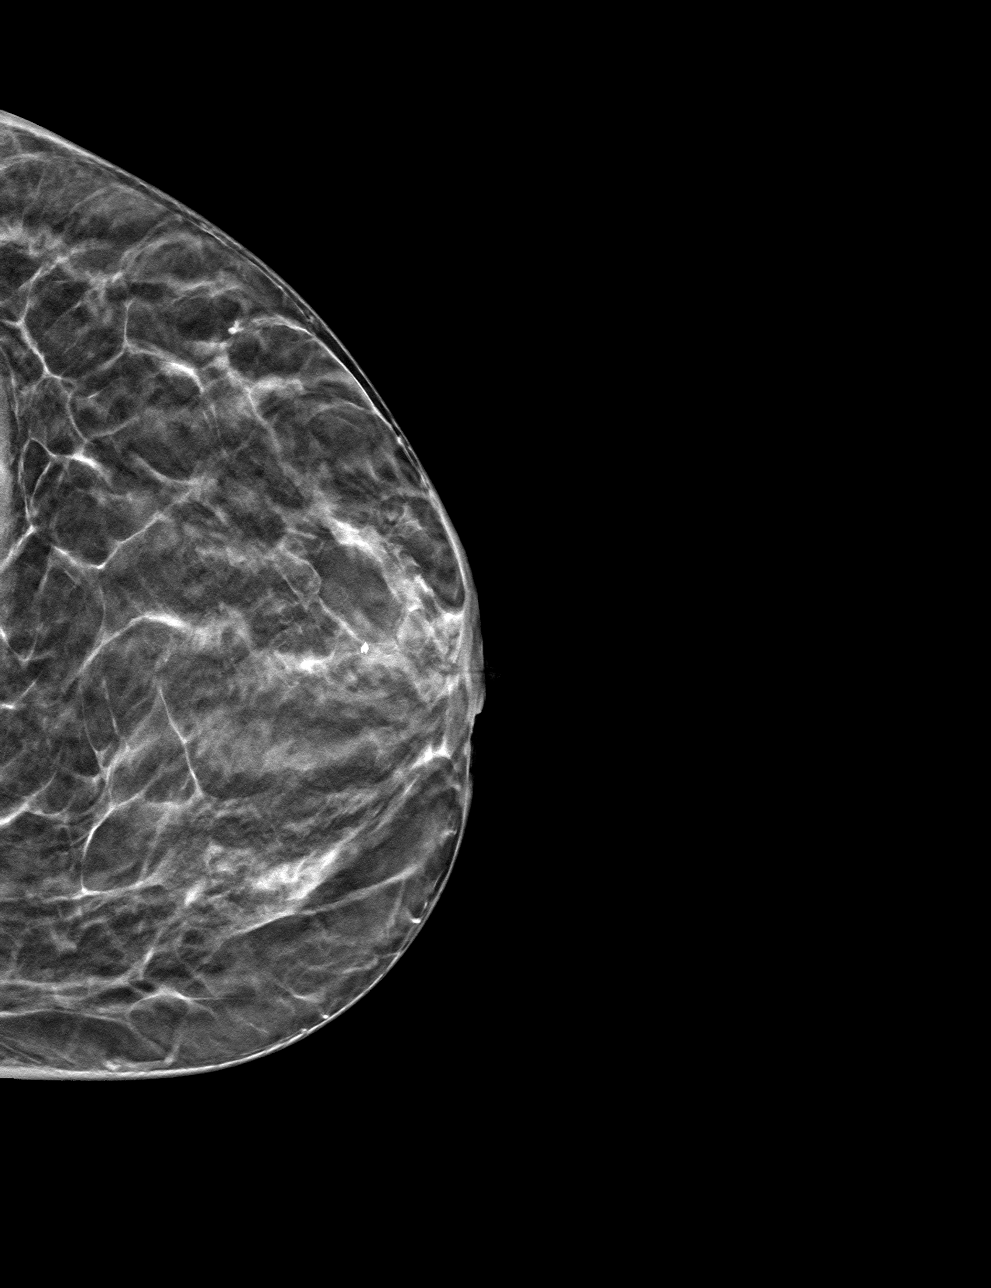

[L ML tomo · tomo slice 23/46.0]
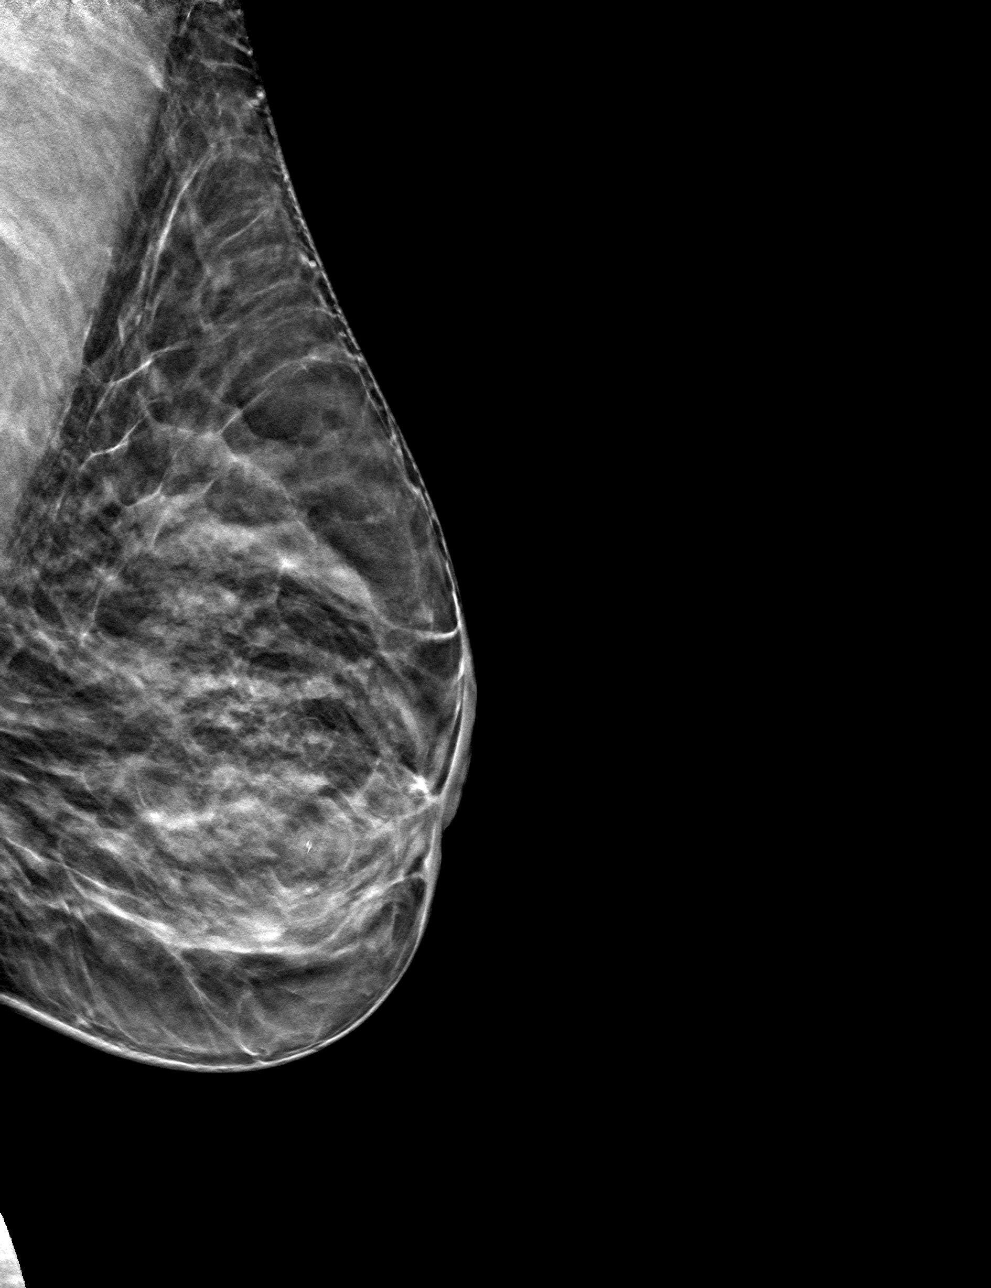

[4 of 12 positions shown; findings below may reference images not displayed]

FINDINGS: Mammographic images were obtained following ultrasound guided biopsy
of the left breast. The biopsy marking clip is in the central
subareolar left breast. This does not correspond with the location
of the previously identified asymmetry, which is slightly medial
retroareolar. However, the asymmetry is no longer well seen on
today's views and may have decompressed with the biopsy.
IMPRESSION: Ribbon shaped clip in the subareolar left breast. This does not
correspond with the location of the mammographically identified
asymmetry, though the asymmetry is no longer seen on today's clip
films.

Final Assessment: Post Procedure Mammograms for Marker Placement

## 2022-09-30 NOTE — Progress Notes (Unsigned)
Referring Provider: Mack Hook, MD Primary Care Physician:  Mack Hook, MD  Reason for Consultation:  Fecal occult blood test positive   IMPRESSION:  FIT + 1-/22 No prior colonoscopy  PLAN: Colonoscopy   HPI: Madison Adkins is a 56 y.o. female referred by Dr. Amil Amen for a positive fecal occult blood test.  The history is obtained through the patient review of her electronic health record.  FIT + 10/22  No prior colonoscopy.  No overt GI blood loss. No melena, hematochezia, bright red blood per rectum. No epistaxis, vaginal bleeding, hemoptysis, or hematuria.    Normal CBC 07/29/22  There is no known family history of colon cancer or polyps. No family history of stomach cancer or other GI malignancy. No family history of inflammatory bowel disease or celiac.    Past Medical History:  Diagnosis Date   Abnormal mammogram 09/19/2020   Benign findings ultimately on left breast excisional biopsy 12/2020   Abnormal Pap smear of cervix 2003   [facility]   Fibrocystic breast disease, left 01/10/2021   Hyperlipidemia 2019   Very high HDL however.    Past Surgical History:  Procedure Laterality Date   RADIOACTIVE SEED GUIDED EXCISIONAL BREAST BIOPSY Left 01/10/2021   Procedure: LEFT BREAST SEED LOCALIZED EXCISIONAL BIOPSY;  Surgeon: Stark Klein, MD;  Location: [facility];  Service: General;  Laterality: Left;      Current Outpatient Medications  Medication Sig Dispense Refill   Calcium Citrate-Vitamin D 500-10 MG-MCG CHEW Chew by mouth.     Cholecalciferol (VITAMIN D3) 25 MCG (1000 UT) CAPS Take 1 capsule (1,000 Units total) by mouth daily.     VITAMIN D, ERGOCALCIFEROL, PO Take by mouth. occasionally (Patient not taking: Reported on 07/29/2022)     No current facility-administered medications for this visit.    Allergies as of 10/01/2022   (No Known Allergies)    Family History  Problem Relation Age of Onset   Cancer Sister         Breast   Breast cancer Sister    Allergies Daughter    Allergies Daughter    Cancer Daughter        Ovarian cancer--died 09-28-19.    Social History   Socioeconomic History   Marital status: Divorced    Spouse name: Not on file   Number of children: 3   Years of education: 12+   Highest education level: Some college, no degree  Occupational History   Occupation: Development worker, community  Tobacco Use   Smoking status: Never    Passive exposure: Never   Smokeless tobacco: Never  Vaping Use   Vaping Use: Never used  Substance and Sexual Activity   Alcohol use: Not Currently    Comment: Rare previously, but stopped altogether end of 2021.   Drug use: No   Sexual activity: Not Currently    Birth control/protection: Post-menopausal  Other Topics Concern   Not on file  Social History Narrative   Originally from [place]   Came to [place] In [place]   Lives with her 2 daughters, though one is obtaining her masters at [employer].  Other daughter with major in [place] and Gender studies who is looking for a job.   Father not involved.   Goes to [employer] in [place]   Would like to go to college.  She is waiting until her daughters are 46 yo.    Social Determinants of Health   Financial Resource Strain: Low Risk  (  07/29/2022)   Overall Financial Resource Strain (CARDIA)    Difficulty of Paying Living Expenses: Not very hard  Food Insecurity: Food Insecurity Present (09/25/2021)   Hunger Vital Sign    Worried About Running Out of Food in the Last Year: Often true    Ran Out of Food in the Last Year: Often true  Transportation Needs: No Transportation Needs (07/29/2022)   PRAPARE - Hydrologist (Medical): No    Lack of Transportation (Non-Medical): No  Physical Activity: Insufficiently Active (11/21/2017)   Exercise Vital Sign    Days of Exercise per Week: 7 days    Minutes of Exercise per Session: 20 min  Stress: Stress Concern Present (11/21/2017)    [place]    Feeling of Stress : To some extent  Social Connections: Moderately Integrated (11/21/2017)   Social Connection and Isolation Panel [NHANES]    Frequency of Communication with Friends and Family: Once a week    Frequency of Social Gatherings with Friends and Family: More than three times a week    Attends Religious Services: More than 4 times per year    Active Member of [employer] or Organizations: Yes    Attends Music therapist: More than 4 times per year    Marital Status: Divorced  Intimate Partner Violence: Not At Risk (07/27/2021)   Humiliation, Afraid, Rape, and Kick questionnaire    Fear of Current or Ex-Partner: No    Emotionally Abused: No    Physically Abused: No    Sexually Abused: No    Review of Systems: 12 system ROS is negative except as noted above.   Physical Exam: General:   Alert,  well-nourished, pleasant and cooperative in NAD Head:  Normocephalic and atraumatic. Eyes:  Sclera clear, no icterus.   Conjunctiva pink. Ears:  Normal auditory acuity. Nose:  No deformity, discharge,  or lesions. Mouth:  No deformity or lesions.   Neck:  Supple; no masses or thyromegaly. Lungs:  Clear throughout to auscultation.   No wheezes. Heart:  Regular rate and rhythm; no murmurs. Abdomen:  Soft, nontender, nondistended, normal bowel sounds, no rebound or guarding. No hepatosplenomegaly.   Rectal:  Deferred  Msk:  Symmetrical. No boney deformities LAD: No inguinal or umbilical LAD Extremities:  No clubbing or edema. Neurologic:  Alert and  oriented x4;  grossly nonfocal Skin:  Intact without significant lesions or rashes. Psych:  Alert and cooperative. Normal mood and affect.    Kayson Bullis L. Tarri Glenn, MD, MPH 09/30/2022, 9:08 AM

## 2022-10-01 ENCOUNTER — Encounter: Payer: Self-pay | Admitting: Gastroenterology

## 2022-10-01 ENCOUNTER — Ambulatory Visit (INDEPENDENT_AMBULATORY_CARE_PROVIDER_SITE_OTHER): Payer: Self-pay | Admitting: Gastroenterology

## 2022-10-01 VITALS — BP 110/78 | HR 76 | Ht 63.0 in | Wt 127.2 lb

## 2022-10-01 DIAGNOSIS — R195 Other fecal abnormalities: Secondary | ICD-10-CM

## 2022-10-01 NOTE — Patient Instructions (Addendum)
It was a pleasure to meet you today.  We discussed a colonoscopy for further evaluation of your positive stool test.  Tips for colonoscopy:  - Stay well hydrated for 3-4 days prior to the exam. This reduces nausea and dehydration.  - To prevent skin/hemorrhoid irritation - prior to wiping, put A&Dointment or vaseline on the toilet paper. - Keep a towel or pad on the bed.  - Drink  64oz of clear liquids in the morning of prep day (prior to starting the prep) to be sure that there is enough fluid to flush the colon and stay hydrated!!!! This is in addition to the fluids required for preparation. - Use of a flavored hard candy, such as grape Anise Salvo, can counteract some of the flavor of the prep and may prevent some nausea.    _______________________________________________________  If you are age 73 or older, your body mass index should be between 23-30. Your Body mass index is 22.53 kg/m. If this is out of the aforementioned range listed, please consider follow up with your Primary Care Provider.  If you are age 43 or younger, your body mass index should be between 19-25. Your Body mass index is 22.53 kg/m. If this is out of the aformentioned range listed, please consider follow up with your Primary Care Provider.   ________________________________________________________  The [facility] providers would like to encourage you to use [facility] to communicate with providers for non-urgent requests or questions.  Due to long hold times on the telephone, sending your provider a message by [facility] may be a faster and more efficient way to get a response.  Please allow 48 business hours for a response.  Please remember that this is for non-urgent requests.  _______________________________________________________  .

## 2022-10-03 ENCOUNTER — Ambulatory Visit
Admission: RE | Admit: 2022-10-03 | Discharge: 2022-10-03 | Disposition: A | Discharge status: Home / Self Care | Payer: Self-pay | Source: Ambulatory Visit | Attending: Internal Medicine | Admitting: Internal Medicine

## 2022-10-03 DIAGNOSIS — Z1231 Encounter for screening mammogram for malignant neoplasm of breast: Secondary | ICD-10-CM

## 2022-10-25 ENCOUNTER — Ambulatory Visit (AMBULATORY_SURGERY_CENTER): Payer: Self-pay | Admitting: Gastroenterology

## 2022-10-25 ENCOUNTER — Encounter: Payer: Self-pay | Admitting: Gastroenterology

## 2022-10-25 VITALS — BP 111/71 | HR 71 | Temp 97.1°F | Resp 12 | Ht 63.0 in | Wt 127.0 lb

## 2022-10-25 DIAGNOSIS — D125 Benign neoplasm of sigmoid colon: Secondary | ICD-10-CM

## 2022-10-25 DIAGNOSIS — K635 Polyp of colon: Secondary | ICD-10-CM

## 2022-10-25 DIAGNOSIS — R195 Other fecal abnormalities: Secondary | ICD-10-CM

## 2022-10-25 DIAGNOSIS — D122 Benign neoplasm of ascending colon: Secondary | ICD-10-CM

## 2022-10-25 DIAGNOSIS — D123 Benign neoplasm of transverse colon: Secondary | ICD-10-CM

## 2022-10-25 DIAGNOSIS — D120 Benign neoplasm of cecum: Secondary | ICD-10-CM

## 2022-10-25 MED ORDER — SODIUM CHLORIDE 0.9 % IV SOLN: 500.0000 mL | INTRAVENOUS | Status: DC

## 2022-10-25 NOTE — Patient Instructions (Addendum)
Patient has a contact number available for emergencies. The signs and symptoms of potential delayed complications were discussed with the patient. Return to normal activities tomorrow. Written discharge instructions were provided to the patient. - High fiber diet. - Continue present medications. - Await pathology results. - Repeat colonoscopy date to be determined after pending pathology results are reviewed for surveillance. - Emerging evidence supports eating a diet of fruits, vegetables, grains, calcium, and yogurt while reducing red meat and alcohol may reduce the risk of colon cancer. - Given these results, all first degree relatives (brothers, sisters, children, parents) should start colon cancer screening at age 36. - Thank you for allowing me to be involved in your colon cancer prevention. Madison Adkins  YOU HAD AN ENDOSCOPIC PROCEDURE TODAY: Refer to the procedure report and other information in the discharge instructions given to you for any specific questions about what was found during the examination. If this information does not answer your questions, please call [facility] office at [phone] to clarify.   YOU SHOULD EXPECT: Some feelings of bloating in the abdomen. Passage of more gas than usual. Walking can help get rid of the air that was put into your GI tract during the procedure and reduce the bloating. If you had a lower endoscopy (such as a colonoscopy or flexible sigmoidoscopy) you may notice spotting of blood in your stool or on the toilet paper. Some abdominal soreness may be present for a day or two, also.  DIET: Your first meal following the procedure should be a light meal and then it is ok to progress to your normal diet. A half-sandwich or bowl of soup is an example of a good first meal. Heavy or fried foods are harder to digest and may make you feel nauseous or bloated. Drink plenty of fluids but you should avoid alcoholic beverages for 24 hours. If you had a  esophageal dilation, please see attached instructions for diet.    ACTIVITY: Your care partner should take you home directly after the procedure. You should plan to take it easy, moving slowly for the rest of the day. You can resume normal activity the day after the procedure however YOU SHOULD NOT DRIVE, use power tools, machinery or perform tasks that involve climbing or major physical exertion for 24 hours (because of the sedation medicines used during the test).   SYMPTOMS TO REPORT IMMEDIATELY: A gastroenterologist can be reached at any hour. Please call [phone]  for any of the following symptoms:  Following lower endoscopy (colonoscopy, flexible sigmoidoscopy) Excessive amounts of blood in the stool  Significant tenderness, worsening of abdominal pains  Swelling of the abdomen that is new, acute  Fever of 100 or higher   FOLLOW UP:  If any biopsies were taken you will be contacted by phone or by letter within the next 1-3 weeks. Call [phone]  if you have not heard about the biopsies in 3 weeks.  Please also call with any specific questions about appointments or follow up tests.

## 2022-10-25 NOTE — Progress Notes (Signed)
Indication for colonoscopy: FIT positive  Please see my office note from 10/01/2022 for complete details.  There is been no significant change in history or physical exam since that time.  She remains an appropriate candidate for monitored anesthesia care in the endoscopy center.

## 2022-10-25 NOTE — Progress Notes (Signed)
Called to room to assist during endoscopic procedure.  Patient ID and intended procedure confirmed with present staff. Received instructions for my participation in the procedure from the performing physician.  

## 2022-10-25 NOTE — Progress Notes (Signed)
Report to PACU, RN, vss, BBS= Clear.  

## 2022-10-25 NOTE — Op Note (Signed)
[place] Patient Name: Madison Adkins Procedure Date: 10/25/2022 9:36 AM MRN: [id number] Endoscopist: Thornton Park MD, MD, [id number] Age: 56 Referring MD:  Date of Birth: Nov 15, 1965 Gender: Female Account #: [id number] Procedure:                Colonoscopy Indications:              This is the patient's first colonoscopy, Positive                            fecal immunochemical test Medicines:                Monitored Anesthesia Care Procedure:                Pre-Anesthesia Assessment:                           - Prior to the procedure, a History and Physical                            was performed, and patient medications and                            allergies were reviewed. The patient's tolerance of                            previous anesthesia was also reviewed. The risks                            and benefits of the procedure and the sedation                            options and risks were discussed with the patient.                            All questions were answered, and informed consent                            was obtained. Prior Anticoagulants: The patient has                            taken no anticoagulant or antiplatelet agents. ASA                            Grade Assessment: II - A patient with mild systemic                            disease. After reviewing the risks and benefits,                            the patient was deemed in satisfactory condition to                            undergo the procedure.  After obtaining informed consent, the colonoscope                            was passed under direct vision. Throughout the                            procedure, the patient's blood pressure, pulse, and                            oxygen saturations were monitored continuously. The                            Colonoscope was introduced through the anus and                            advanced to the 3 cm into the  ileum. A second                            forward view of the right colon was performed. The                            colonoscopy was performed without difficulty. The                            patient tolerated the procedure well. The quality                            of the bowel preparation was good. The terminal                            ileum, ileocecal valve, appendiceal orifice, and                            rectum were photographed. Scope In: 9:54:20 AM Scope Out: 10:26:01 AM Scope Withdrawal Time: 0 hours 26 minutes 12 seconds  Total Procedure Duration: 0 hours 31 minutes 41 seconds  Findings:                 The perianal and digital rectal examinations were                            normal.                           Non-bleeding internal hemorrhoids were found.                           A few small-mouthed diverticula were found in the                            sigmoid colon, descending colon and ascending colon.                           Two semi-pedunculated polyps were found in the  distal sigmoid colon, 20 and 24 cm from the anus.                            The polyps were 6 to 15 mm in size. These polyps                            were removed with a cold snare. Resection and                            retrieval were complete. Estimated blood loss was                            minimal. Area was tattooed with an injection of 3                            mL of Spot (carbon black).                           A 3 mm polyp was found in the transverse colon. The                            polyp was sessile. The polyp was removed with a                            cold snare. Resection and retrieval were complete.                            Estimated blood loss was minimal.                           Five sessile polyps were found in the ascending                            colon. The polyps were 2 to 5 mm in size. These                             polyps were removed with a cold snare. Resection                            and retrieval were complete. Estimated blood loss                            was minimal.                           A 3 mm polyp was found in the cecum. The polyp was                            sessile. The polyp was removed with a cold snare.                            Resection and retrieval were  complete. Estimated                            blood loss was minimal.                           The exam was otherwise without abnormality on                            direct and retroflexion views. Complications:            No immediate complications. Estimated Blood Loss:     Estimated blood loss was minimal. Impression:               - Non-bleeding internal hemorrhoids.                           - Diverticulosis in the sigmoid colon, in the                            descending colon and in the ascending colon.                           - Two 6 to 15 mm polyps in the distal sigmoid                            colon, removed with a cold snare. Resected and                            retrieved. Tattooed.                           - One 3 mm polyp in the transverse colon, removed                            with a cold snare. Resected and retrieved.                           - Five 2 to 5 mm polyps in the ascending colon,                            removed with a cold snare. Resected and retrieved.                           - One 3 mm polyp in the cecum, removed with a cold                            snare. Resected and retrieved.                           - The examination was otherwise normal on direct                            and retroflexion views. Recommendation:           - Patient  has a contact number available for                            emergencies. The signs and symptoms of potential                            delayed complications were discussed with the                            patient. Return to  normal activities tomorrow.                            Written discharge instructions were provided to the                            patient.                           - High fiber diet.                           - Continue present medications.                           - Await pathology results.                           - Repeat colonoscopy date to be determined after                            pending pathology results are reviewed for                            surveillance.                           - Emerging evidence supports eating a diet of                            fruits, vegetables, grains, calcium, and yogurt                            while reducing red meat and alcohol may reduce the                            risk of colon cancer.                           - Given these results, all first degree relatives                            (brothers, sisters, children, parents) should start                            colon cancer screening at age 10.                           -  Thank you for allowing me to be involved in your                            colon cancer prevention. Thornton Park MD, MD 10/25/2022 10:37:20 AM This report has been signed electronically.

## 2022-10-30 ENCOUNTER — Telehealth: Payer: Self-pay

## 2022-10-30 NOTE — Telephone Encounter (Signed)
  Follow up Call-     10/25/2022    9:19 AM  Call back number  Post procedure Call Back phone  # [phone]  Permission to leave phone message Yes     Patient questions:  Do you have a fever, pain , or abdominal swelling? No. Pain Score  0 *  Have you tolerated food without any problems? Yes.    Have you been able to return to your normal activities? Yes.    Do you have any questions about your discharge instructions: Diet   No. Medications  No. Follow up visit  No.  Do you have questions or concerns about your Care? No.  Actions: * If pain score is 4 or above: No action needed, pain <4.

## 2022-10-31 ENCOUNTER — Encounter: Payer: Self-pay | Admitting: Gastroenterology

## 2022-11-26 ENCOUNTER — Encounter: Payer: Self-pay | Admitting: Gastroenterology

## 2022-12-03 ENCOUNTER — Other Ambulatory Visit: Payer: Self-pay

## 2022-12-03 DIAGNOSIS — R7989 Other specified abnormal findings of blood chemistry: Secondary | ICD-10-CM

## 2022-12-04 LAB — VITAMIN D 25 HYDROXY (VIT D DEFICIENCY, FRACTURES): Vit D, 25-Hydroxy: 33.2 ng/mL (ref 30.0–100.0)

## 2023-06-11 ENCOUNTER — Telehealth: Payer: Self-pay

## 2023-06-11 NOTE — Telephone Encounter (Signed)
Patient would like to be on waiting list for a CPE after 07/30/23, patient is available on Mondays at 8:30am.  Will call patient if there is a cancellation.

## 2023-08-04 ENCOUNTER — Encounter: Payer: Self-pay | Admitting: Internal Medicine

## 2023-08-21 ENCOUNTER — Telehealth: Payer: Self-pay

## 2023-08-21 NOTE — Telephone Encounter (Signed)
Telephoned patient at mobile number. Left a voice message, mailed scholarship application. [employer]

## 2023-09-03 ENCOUNTER — Telehealth: Payer: Self-pay

## 2023-09-03 NOTE — Telephone Encounter (Signed)
Telephoned patient at mobile number. Left a voice message with [employer] (scholarship) contact information. 

## 2023-09-16 ENCOUNTER — Other Ambulatory Visit: Payer: Self-pay | Admitting: Internal Medicine

## 2023-09-16 DIAGNOSIS — Z1231 Encounter for screening mammogram for malignant neoplasm of breast: Secondary | ICD-10-CM

## 2023-10-16 ENCOUNTER — Ambulatory Visit
Admission: RE | Admit: 2023-10-16 | Discharge: 2023-10-16 | Disposition: A | Discharge status: Home / Self Care | Payer: No Typology Code available for payment source | Source: Ambulatory Visit | Attending: Internal Medicine | Admitting: Internal Medicine

## 2023-10-16 DIAGNOSIS — Z1231 Encounter for screening mammogram for malignant neoplasm of breast: Secondary | ICD-10-CM

## 2023-10-31 NOTE — Telephone Encounter (Signed)
 Patient's CPE appointment was rescheduled.   Patient would like at least 2 days of notice to be able to book transportation for appointment.

## 2023-11-10 ENCOUNTER — Encounter: Payer: Self-pay | Admitting: Internal Medicine

## 2023-12-26 NOTE — Telephone Encounter (Signed)
 Called patient to offer appointment, patient did not take as she is out of town.

## 2024-03-24 ENCOUNTER — Encounter: Payer: Self-pay | Admitting: Internal Medicine

## 2024-03-24 ENCOUNTER — Other Ambulatory Visit: Payer: Self-pay | Admitting: Internal Medicine

## 2024-03-24 ENCOUNTER — Ambulatory Visit: Payer: Self-pay | Admitting: Internal Medicine

## 2024-03-24 VITALS — BP 134/88 | HR 74 | Resp 16 | Ht 63.0 in | Wt 127.0 lb

## 2024-03-24 DIAGNOSIS — Z124 Encounter for screening for malignant neoplasm of cervix: Secondary | ICD-10-CM

## 2024-03-24 DIAGNOSIS — Z0000 Encounter for general adult medical examination without abnormal findings: Secondary | ICD-10-CM

## 2024-03-24 DIAGNOSIS — Z23 Encounter for immunization: Secondary | ICD-10-CM

## 2024-03-24 DIAGNOSIS — Z1231 Encounter for screening mammogram for malignant neoplasm of breast: Secondary | ICD-10-CM

## 2024-03-24 DIAGNOSIS — E7800 Pure hypercholesterolemia, unspecified: Secondary | ICD-10-CM

## 2024-03-24 DIAGNOSIS — R7989 Other specified abnormal findings of blood chemistry: Secondary | ICD-10-CM

## 2024-03-24 MED ORDER — CALCIUM CITRATE 250 MG PO TABS: ORAL_TABLET | ORAL | Status: AC

## 2024-03-24 NOTE — Progress Notes (Signed)
 Subjective:    Patient ID: Madison Adkins, female   DOB: [birth year], 58 y.o.   MRN: [id number]   HPI  Has not been seen since 07/2022  CPE with pap  1.  Pap:  Last 10/2020 and normal.  Abnormal pap smear likely in 2003 in [place].  .   2.  Mammogram:  Last in 09/2023 and normal.  Had biopsy of left breast in 2022 with ductal hyperplasia and fibrocystic changes/benign.  Elder sister died at age 59 of breast cancer.  She died in [place].  Not clear if any genetic testing ever performed.    3.  Osteoprevention:  Does not tolerate dairy or almond milk.  Does walk outside almost daily for about 20 minutes.    4.  Guaiac Cards/FIT:  Last in 2023 and positive.  See follow up colonoscopy below.    5.  Colonoscopy:  3 colonic adenomas in 09/2022 with Dr. Savannah Curlin.  To repeat in 3 years or 09/2025.No family history of colon cancer.    6.  Immunizations:  Has not had second Hep A spaced appropriately from first in past.   Has not had influenza nor COvID vaccination this year.   Immunization History  Administered Date(s) Administered   Fluzone Influenza virus vaccine,trivalent (IIV3), split virus 07/22/2008   Hep A / Hep B 08/28/2009, 09/25/2009   Hepatitis B, ADULT 06/26/2022   Influenza,inj,Quad PF,6+ Mos 07/29/2022   Influenza-Unspecified 08/25/2018, 07/30/2021   MMR 08/28/2009, 06/25/2022   Moderna Covid-19 Fall Seasonal Vaccine 37yrs & older 08/06/2022   PFIZER(Purple Top)SARS-COV-2 Vaccination 02/03/2020, 02/28/2020, 10/24/2020   Pfizer Covid-19 Vaccine Bivalent Booster 53yrs & up 07/27/2021   Td 08/28/2009   Tdap 12/16/2018   Zoster Recombinant(Shingrix) 07/27/2021, 09/28/2021     7.  Glucose/Cholesterol:  elevated cholesterol with high HDL in past.  Glucose has always been fine.  Current Meds  Medication Sig   Cholecalciferol (VITAMIN D3) 25 MCG (1000 UT) CAPS Take 1 capsule (1,000 Units total) by mouth daily.   POTASSIUM GLUCONATE PO Take by mouth.   [DISCONTINUED]  potassium chloride (MICRO-K) 10 MEQ CR capsule Take 10 mEq by mouth 2 (two) times daily.   No Known Allergies  Past Medical History:  Diagnosis Date   Abnormal mammogram 09/19/2020   Benign findings ultimately on left breast excisional biopsy 12/2020   Abnormal Pap smear of cervix 2003   [facility]   Fibrocystic breast disease, left 01/10/2021   Hyperlipidemia 2019   Very high HDL however.   Past Surgical History:  Procedure Laterality Date   RADIOACTIVE SEED GUIDED EXCISIONAL BREAST BIOPSY Left 01/10/2021   Procedure: LEFT BREAST SEED LOCALIZED EXCISIONAL BIOPSY;  Surgeon: Lockie Rima, MD;  Location: [facility];  Service: General;  Laterality: Left;    Family History  Problem Relation Age of Onset   Cancer Sister        Breast   Breast cancer Sister    Allergies Daughter    Allergies Daughter    Cancer Daughter        Ovarian cancer--died October 07, 2019.   Esophageal cancer Neg Hx    Liver disease Neg Hx    Colon cancer Neg Hx    Social History   Socioeconomic History   Marital status: Divorced    Spouse name: Not on file   Number of children: 3   Years of education: 12+   Highest education level: Some college, no degree  Occupational History   Occupation:  House cleaning  Tobacco Use   Smoking status: Never    Passive exposure: Never   Smokeless tobacco: Never  Vaping Use   Vaping status: Never Used  Substance and Sexual Activity   Alcohol use: Not Currently    Comment: Rare previously, but stopped altogether end of 2021.   Drug use: No   Sexual activity: Not Currently    Birth control/protection: Post-menopausal  Other Topics Concern   Not on file  Social History Narrative   Originally from [place]   Came to [employer] [place] [employer]. 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   Daughters now living at school:  one is obtaining her masters at [employer].  Other daughter with major in Black and Gender studies who is looking for a job,  looking for a job.    Father not involved.   Goes to [employer] in [place]      Social Drivers of [employer] Strain: Medium Risk (03/24/2024)   Overall Financial Resource Strain (CARDIA)    Difficulty of Paying Living Expenses: Somewhat hard  Food Insecurity: No Food Insecurity (03/24/2024)   Hunger Vital Sign    Worried About Running Out of Food in the Last Year: Never true    Ran Out of Food in the Last Year: Never true  Transportation Needs: No Transportation Needs (03/24/2024)   PRAPARE - Administrator, Civil Service (Medical): No    Lack of Transportation (Non-Medical): No  Physical Activity: Insufficiently Active (11/21/2017)   Exercise Vital Sign    Days of Exercise per Week: 7 days    Minutes of Exercise per Session: 20 min  Stress: Stress Concern Present (11/21/2017)   [employer] of Occupational Health - Occupational Stress Questionnaire    Feeling of Stress : To some extent  Social Connections: Moderately Integrated (11/21/2017)   Social Connection and Isolation Panel [NHANES]    Frequency of Communication with Friends and Family: Once a week    Frequency of Social Gatherings with Friends and Family: More than three times a week    Attends Religious Services: More than 4 times per year    Active Member of [employer] or Organizations: Yes    Attends Banker Meetings: More than 4 times per year    Marital Status: Divorced  Intimate Partner Violence: Not At Risk (03/24/2024)   Humiliation, Afraid, Rape, and Kick questionnaire    Fear of Current or Ex-Partner: No    Emotionally Abused: No    Physically Abused: No    Sexually Abused: No      Review of Systems  HENT:  Positive for tinnitus (1 year ago.   Took potassium OTC and seemed to stop.  Had only 2 episodes.  No hearing loss or vertigo.).   Respiratory:  Negative for shortness of breath.   Cardiovascular:  Negative for chest pain, palpitations and leg swelling.   Gastrointestinal:  Negative for blood in stool (No melena.).  Genitourinary:  Negative for vaginal bleeding.  Psychiatric/Behavioral:  Negative for dysphoric mood. The patient is not nervous/anxious.       Objective:   BP 134/88 (BP Location: Right Arm, Patient Position: Sitting, Cuff Size: Normal)   Pulse 74   Resp 16   Ht 5\' 3"  (1.6 m)   Wt 127 lb (57.6 kg)   LMP 09/11/2011   SpO2 98%   BMI 22.50 kg/m   Physical Exam Constitutional:      Appearance: Normal  appearance.  HENT:     Head: Normocephalic and atraumatic.     Right Ear: Tympanic membrane, ear canal and external ear normal.     Left Ear: Tympanic membrane, ear canal and external ear normal.     Nose: Nose normal.     Mouth/Throat:     Mouth: Mucous membranes are moist.     Pharynx: Oropharynx is clear.  Eyes:     Extraocular Movements: Extraocular movements intact.     Conjunctiva/sclera: Conjunctivae normal.     Pupils: Pupils are equal, round, and reactive to light.     Comments: Unable to see discs well.  Neck:     Thyroid: No thyroid mass or thyromegaly.  Cardiovascular:     Rate and Rhythm: Normal rate and regular rhythm.     Heart sounds: S1 normal and S2 normal. No murmur heard.    No friction rub. No S3 or S4 sounds.     Comments: No carotid bruits.  Carotid, radial, femoral, DP and PT pulses normal and equal.   Pulmonary:     Effort: Pulmonary effort is normal.     Breath sounds: Normal breath sounds and air entry.  Chest:  Breasts:    Right: No inverted nipple, mass or nipple discharge.     Left: No inverted nipple, mass or nipple discharge.  Abdominal:     General: Bowel sounds are normal.     Palpations: Abdomen is soft. There is no hepatomegaly, splenomegaly or mass.     Tenderness: There is no abdominal tenderness.     Hernia: No hernia is present.  Genitourinary:    Comments: Normal external female genitalia No uterine or adnexal mass or tenderness. Cervix and vaginal mucosa without  lesion or inflammation.  Musculoskeletal:        General: Normal range of motion.     Cervical back: Normal range of motion and neck supple.     Right lower leg: No edema.     Left lower leg: No edema.  Lymphadenopathy:     Head:     Right side of head: No submental or submandibular adenopathy.     Left side of head: No submental or submandibular adenopathy.     Cervical: No cervical adenopathy.     Upper Body:     Right upper body: No supraclavicular or axillary adenopathy.     Left upper body: No supraclavicular or axillary adenopathy.     Lower Body: No right inguinal adenopathy. No left inguinal adenopathy.  Skin:    General: Skin is warm.     Capillary Refill: Capillary refill takes less than 2 seconds.     Findings: No rash.  Neurological:     General: No focal deficit present.     Mental Status: She is alert and oriented to person, place, and time.     Cranial Nerves: Cranial nerves 2-12 are intact.     Sensory: Sensation is intact.     Motor: Motor function is intact.     Coordination: Coordination is intact.     Gait: Gait is intact.     Deep Tendon Reflexes: Reflexes are normal and symmetric.  Psychiatric:        Mood and Affect: Mood normal.        Speech: Speech normal.        Behavior: Behavior normal. Behavior is cooperative.      Assessment & Plan   CPE with pap Mammogram in December. Hep A #2 Spikevax   for 2024-2025 Recommend calling in in Sept for influenza and 2025-2026 COVID Out of Pneumococcal 20 conjugate--can give at later date.   Repeat colonoscopy in 12. 2026 CBC, CMP, FLP, Vitamin D  level.  2.  Low vitamin D  level in past:  taking D3 daily.  Encouraged calcium  citrate 500 to 600 mg twice daily and continued weight bearing exercise outside.  3.  Hypercholesterolemia with high HDL:  FLP

## 2024-03-25 LAB — COMPREHENSIVE METABOLIC PANEL WITH GFR
ALT: 10 IU/L (ref 0–32)
AST: 14 IU/L (ref 0–40)
Albumin: 4.6 g/dL (ref 3.8–4.9)
Alkaline Phosphatase: 55 IU/L (ref 44–121)
BUN/Creatinine Ratio: 16 (ref 9–23)
BUN: 12 mg/dL (ref 6–24)
Bilirubin Total: 0.4 mg/dL (ref 0.0–1.2)
CO2: 25 mmol/L (ref 20–29)
Calcium: 9.7 mg/dL (ref 8.7–10.2)
Chloride: 100 mmol/L (ref 96–106)
Creatinine, Ser: 0.75 mg/dL (ref 0.57–1.00)
Globulin, Total: 3 g/dL (ref 1.5–4.5)
Glucose: 67 mg/dL — ABNORMAL LOW (ref 70–99)
Potassium: 3.7 mmol/L (ref 3.5–5.2)
Sodium: 141 mmol/L (ref 134–144)
Total Protein: 7.6 g/dL (ref 6.0–8.5)
eGFR: 93 mL/min/{1.73_m2} (ref >59)

## 2024-03-25 LAB — CBC WITH DIFFERENTIAL/PLATELET
Basophils Absolute: 0 10*3/uL (ref 0.0–0.2)
Basos: 1 %
EOS (ABSOLUTE): 0 10*3/uL (ref 0.0–0.4)
Eos: 1 %
Hematocrit: 39.6 % (ref 34.0–46.6)
Hemoglobin: 12.4 g/dL (ref 11.1–15.9)
Immature Grans (Abs): 0 10*3/uL (ref 0.0–0.1)
Immature Granulocytes: 0 %
Lymphocytes Absolute: 3.3 10*3/uL — ABNORMAL HIGH (ref 0.7–3.1)
Lymphs: 57 %
MCH: 27.2 pg (ref 26.6–33.0)
MCHC: 31.3 g/dL — ABNORMAL LOW (ref 31.5–35.7)
MCV: 87 fL (ref 79–97)
Monocytes Absolute: 0.5 10*3/uL (ref 0.1–0.9)
Monocytes: 8 %
Neutrophils Absolute: 1.9 10*3/uL (ref 1.4–7.0)
Neutrophils: 33 %
Platelets: 220 10*3/uL (ref 150–450)
RBC: 4.56 x10E6/uL (ref 3.77–5.28)
RDW: 12 % (ref 11.7–15.4)
WBC: 5.7 10*3/uL (ref 3.4–10.8)

## 2024-03-25 LAB — LIPID PANEL W/O CHOL/HDL RATIO
Cholesterol, Total: 243 mg/dL — ABNORMAL HIGH (ref 100–199)
HDL: 74 mg/dL (ref >39)
LDL Chol Calc (NIH): 162 mg/dL — ABNORMAL HIGH (ref 0–99)
Triglycerides: 47 mg/dL (ref 0–149)
VLDL Cholesterol Cal: 7 mg/dL (ref 5–40)

## 2024-03-25 LAB — VITAMIN D 25 HYDROXY (VIT D DEFICIENCY, FRACTURES): Vit D, 25-Hydroxy: 35.6 ng/mL (ref 30.0–100.0)

## 2024-03-26 ENCOUNTER — Ambulatory Visit: Payer: Self-pay | Admitting: Internal Medicine

## 2024-03-26 LAB — CYTOLOGY - PAP

## 2024-03-26 NOTE — Telephone Encounter (Signed)
 Patient has been seen.

## 2024-11-04 ENCOUNTER — Ambulatory Visit
Admission: RE | Admit: 2024-11-04 | Discharge: 2024-11-04 | Disposition: A | Discharge status: Home / Self Care | Source: Ambulatory Visit | Attending: Internal Medicine | Admitting: Internal Medicine

## 2024-11-04 DIAGNOSIS — Z1231 Encounter for screening mammogram for malignant neoplasm of breast: Secondary | ICD-10-CM

## 2025-03-29 ENCOUNTER — Encounter: Payer: Self-pay | Admitting: Internal Medicine
